# Patient Record
Sex: Female | Born: 1970
Health system: Southern US, Community
[De-identification: ages and names within clinical notes are randomized; demographics above are authoritative.]

## PROBLEM LIST (undated history)

## (undated) DIAGNOSIS — N289 Disorder of kidney and ureter, unspecified: Secondary | ICD-10-CM

## (undated) HISTORY — PX: FRACTURE SURGERY: SHX138

## (undated) HISTORY — PX: TUBAL LIGATION: SHX77

## (undated) HISTORY — PX: UTERINE STENT PLACEMENT: SHX6655

---

## 2004-06-01 ENCOUNTER — Emergency Department: Payer: Self-pay | Admitting: Emergency Medicine

## 2005-01-02 ENCOUNTER — Ambulatory Visit: Payer: Self-pay | Admitting: Unknown Physician Specialty

## 2005-05-19 ENCOUNTER — Emergency Department: Payer: Self-pay | Admitting: Emergency Medicine

## 2006-09-03 ENCOUNTER — Ambulatory Visit: Payer: Self-pay | Admitting: Unknown Physician Specialty

## 2007-03-25 ENCOUNTER — Ambulatory Visit: Payer: Self-pay | Admitting: Internal Medicine

## 2009-03-20 ENCOUNTER — Emergency Department: Payer: Self-pay | Admitting: Emergency Medicine

## 2011-11-16 ENCOUNTER — Emergency Department: Payer: Self-pay | Admitting: Emergency Medicine

## 2011-11-19 ENCOUNTER — Emergency Department: Payer: Self-pay | Admitting: Emergency Medicine

## 2012-11-26 ENCOUNTER — Emergency Department: Payer: Self-pay | Admitting: Emergency Medicine

## 2012-11-26 LAB — URINALYSIS, COMPLETE
Bilirubin,UR: NEGATIVE
Glucose,UR: NEGATIVE mg/dL (ref 0–75)
Hyaline Cast: 2
Nitrite: NEGATIVE
Ph: 6 (ref 4.5–8.0)
Protein: 30
RBC,UR: 8 /HPF (ref 0–5)
Specific Gravity: 1.018 (ref 1.003–1.030)
Squamous Epithelial: 10
WBC UR: 14 /HPF (ref 0–5)

## 2012-11-26 LAB — CBC
HCT: 42 % (ref 35.0–47.0)
HGB: 14.1 g/dL (ref 12.0–16.0)
MCH: 31.1 pg (ref 26.0–34.0)
MCHC: 33.5 g/dL (ref 32.0–36.0)
MCV: 93 fL (ref 80–100)
Platelet: 274 10*3/uL (ref 150–440)
RBC: 4.53 10*6/uL (ref 3.80–5.20)
RDW: 13.3 % (ref 11.5–14.5)
WBC: 8 10*3/uL (ref 3.6–11.0)

## 2012-11-26 LAB — BASIC METABOLIC PANEL
Anion Gap: 7 (ref 7–16)
BUN: 11 mg/dL (ref 7–18)
Calcium, Total: 8.7 mg/dL (ref 8.5–10.1)
Chloride: 106 mmol/L (ref 98–107)
Co2: 24 mmol/L (ref 21–32)
Creatinine: 0.91 mg/dL (ref 0.60–1.30)
EGFR (African American): >60
EGFR (Non-African Amer.): >60
Glucose: 96 mg/dL (ref 65–99)
Osmolality: 273 (ref 275–301)
Potassium: 3.8 mmol/L (ref 3.5–5.1)
Sodium: 137 mmol/L (ref 136–145)

## 2013-01-02 ENCOUNTER — Emergency Department (HOSPITAL_COMMUNITY)
Admission: EM | Admit: 2013-01-02 | Discharge: 2013-01-02 | Disposition: A | Discharge status: Home / Self Care | Payer: Medicaid Other | Attending: Emergency Medicine | Admitting: Emergency Medicine

## 2013-01-02 ENCOUNTER — Encounter (HOSPITAL_COMMUNITY): Payer: Self-pay | Admitting: Emergency Medicine

## 2013-01-02 DIAGNOSIS — O0091 Unspecified ectopic pregnancy with intrauterine pregnancy: Secondary | ICD-10-CM | POA: Insufficient documentation

## 2013-01-02 DIAGNOSIS — O200 Threatened abortion: Secondary | ICD-10-CM

## 2013-01-02 DIAGNOSIS — Z87442 Personal history of urinary calculi: Secondary | ICD-10-CM | POA: Insufficient documentation

## 2013-01-02 DIAGNOSIS — Z3490 Encounter for supervision of normal pregnancy, unspecified, unspecified trimester: Secondary | ICD-10-CM

## 2013-01-02 HISTORY — DX: Disorder of kidney and ureter, unspecified: N28.9

## 2013-01-02 LAB — HCG, QUANTITATIVE, PREGNANCY: hCG, Beta Chain, Quant, S: 106819 m[IU]/mL — ABNORMAL HIGH (ref <5)

## 2013-01-02 LAB — CBC
HCT: 39 % (ref 36.0–46.0)
Hemoglobin: 13.4 g/dL (ref 12.0–15.0)
MCH: 31 pg (ref 26.0–34.0)
MCHC: 34.4 g/dL (ref 30.0–36.0)
MCV: 90.3 fL (ref 78.0–100.0)
Platelets: 280 10*3/uL (ref 150–400)
RBC: 4.32 MIL/uL (ref 3.87–5.11)
RDW: 12.6 % (ref 11.5–15.5)
WBC: 9.1 10*3/uL (ref 4.0–10.5)

## 2013-01-02 LAB — ABO/RH: ABO/RH(D): O POS

## 2013-01-02 MED ORDER — ONDANSETRON 4 MG PO TBDP: 4.0000 mg | ORAL_TABLET | Freq: Three times a day (TID) | ORAL | Status: DC | PRN

## 2013-01-02 NOTE — ED Provider Notes (Signed)
CSN: [id number]     Arrival date & time 01/02/13  1802 History   First MD Initiated Contact with Patient 01/02/13 2117     Chief Complaint  Patient presents with  . 12w pregnant/ spotting     HPI  Patient is G4 P3 currently approximately [redacted] weeks gestation by dates. For 3 pregnancies complicated only with hyperemesis. Has had minimal nausea and vomiting during this pregnancy. Has not seen OB yet. Has had no cramping or discomfort however she had a little blood when she went to the restroom earlier. Was clear that this was vaginal blood not hematuria. Has no urinary symptoms. Does not know her blood type.  Past Medical History  Diagnosis Date  . Renal disorder     kidney stones   Past Surgical History  Procedure Laterality Date  . Cesarean section     No family history on file. History  Substance Use Topics  . Smoking status: Never Smoker   . Smokeless tobacco: Not on file  . Alcohol Use: No   OB History   Grav Para Term Preterm Abortions TAB SAB Ect Mult Living                 Review of Systems  Constitutional: Negative for fever, chills, diaphoresis, appetite change and fatigue.  HENT: Negative for mouth sores, sore throat and trouble swallowing.   Eyes: Negative for visual disturbance.  Respiratory: Negative for cough, chest tightness, shortness of breath and wheezing.   Cardiovascular: Negative for chest pain.  Gastrointestinal: Negative for nausea, vomiting, abdominal pain, diarrhea and abdominal distention.  Endocrine: Negative for polydipsia, polyphagia and polyuria.  Genitourinary: Positive for vaginal bleeding. Negative for dysuria, frequency and hematuria.  Musculoskeletal: Negative for gait problem.  Skin: Negative for color change, pallor and rash.  Neurological: Negative for dizziness, syncope, light-headedness and headaches.  Hematological: Does not bruise/bleed easily.  Psychiatric/Behavioral: Negative for behavioral problems and confusion.    Allergies   Review of patient's allergies indicates no known allergies.  Home Medications   Current Outpatient Rx  Name  Route  Sig  Dispense  Refill  . ondansetron (ZOFRAN ODT) 4 MG disintegrating tablet   Oral   Take 1 tablet (4 mg total) by mouth every 8 (eight) hours as needed for nausea.   10 tablet   0    BP 135/81  Pulse 78  Temp(Src) 97.9 F (36.6 C) (Oral)  Resp 20  Ht 5\' 4"  (1.626 m)  Wt 140 lb 14.4 oz (63.912 kg)  BMI 24.17 kg/m2  SpO2 99%  LMP 10/18/2012 Physical Exam  Constitutional: She is oriented to person, place, and time. She appears well-developed and well-nourished. No distress.  HENT:  Head: Normocephalic.  Eyes: Conjunctivae are normal. Pupils are equal, round, and reactive to light. No scleral icterus.  Neck: Normal range of motion. Neck supple. No thyromegaly present.  Cardiovascular: Normal rate and regular rhythm.  Exam reveals no gallop and no friction rub.   No murmur heard. Pulmonary/Chest: Effort normal and breath sounds normal. No respiratory distress. She has no wheezes. She has no rales.  Abdominal: Soft. Bowel sounds are normal. She exhibits no distension. There is no tenderness. There is no rebound.  Uterus not palpable lower abdomen. Nontender in the abdomen. She's been here 3/2 hours. She's not had any additional bleeding.  Musculoskeletal: Normal range of motion.  Neurological: She is alert and oriented to person, place, and time.  Skin: Skin is warm and dry. No rash noted.  Psychiatric: She has a normal mood and affect. Her behavior is normal.    ED Course  Procedures (including critical care time) Labs Review Labs Reviewed  HCG, QUANTITATIVE, PREGNANCY - Abnormal; Notable for the following:    hCG, Beta Francene Finders [id number] (*)    All other components within normal limits  CBC  ABO/RH   Imaging Review No results found.  EKG Interpretation   None       MDM   1. Intrauterine pregnancy   2. Threatened miscarriage    Limited  bedside ultrasound shows obvious intrauterine pregnancy with fetal heart tones 146 and good fetal movements. Singleton pregnancy. Discussion I don't think a pelvic exam was necessary at this time she is asymptomatic and had trace bleeding that is now resolved.  Discharge of precautions. Recommended OB followup. Diagnosis first trimester vaginal bleeding with intrauterine pregnancy.    Roney Marion, MD 01/02/13 2136

## 2013-01-02 NOTE — ED Notes (Signed)
Pt states she is [redacted] weeks pregnant and noticed spotting this afternoon, pink in color.

## 2013-03-30 ENCOUNTER — Emergency Department: Payer: Self-pay | Admitting: Emergency Medicine

## 2013-03-30 LAB — CBC WITH DIFFERENTIAL/PLATELET
Basophil #: 0.1 10*3/uL (ref 0.0–0.1)
Basophil %: 0.6 %
Eosinophil #: 0.2 10*3/uL (ref 0.0–0.7)
Eosinophil %: 2.1 %
HCT: 34.3 % — ABNORMAL LOW (ref 35.0–47.0)
HGB: 11.3 g/dL — ABNORMAL LOW (ref 12.0–16.0)
Lymphocyte #: 1.9 10*3/uL (ref 1.0–3.6)
Lymphocyte %: 17.5 %
MCH: 29.8 pg (ref 26.0–34.0)
MCHC: 33 g/dL (ref 32.0–36.0)
MCV: 90 fL (ref 80–100)
Monocyte #: 0.7 x10 3/mm (ref 0.2–0.9)
Monocyte %: 7 %
Neutrophil #: 7.7 10*3/uL — ABNORMAL HIGH (ref 1.4–6.5)
Neutrophil %: 72.8 %
Platelet: 283 10*3/uL (ref 150–440)
RBC: 3.79 10*6/uL — ABNORMAL LOW (ref 3.80–5.20)
RDW: 13.2 % (ref 11.5–14.5)
WBC: 10.6 10*3/uL (ref 3.6–11.0)

## 2013-03-30 LAB — BASIC METABOLIC PANEL
Anion Gap: 7 (ref 7–16)
BUN: 13 mg/dL (ref 7–18)
Calcium, Total: 8.6 mg/dL (ref 8.5–10.1)
Chloride: 106 mmol/L (ref 98–107)
Co2: 23 mmol/L (ref 21–32)
Creatinine: 0.54 mg/dL — ABNORMAL LOW (ref 0.60–1.30)
EGFR (African American): >60
EGFR (Non-African Amer.): >60
Glucose: 78 mg/dL (ref 65–99)
Osmolality: 271 (ref 275–301)
Potassium: 3.7 mmol/L (ref 3.5–5.1)
Sodium: 136 mmol/L (ref 136–145)

## 2013-03-30 LAB — URINALYSIS, COMPLETE
Bilirubin,UR: NEGATIVE
Glucose,UR: NEGATIVE mg/dL (ref 0–75)
Ketone: NEGATIVE
Nitrite: NEGATIVE
Ph: 6 (ref 4.5–8.0)
Protein: NEGATIVE
RBC,UR: 1 /HPF (ref 0–5)
Specific Gravity: 1.018 (ref 1.003–1.030)
Squamous Epithelial: 9
WBC UR: 7 /HPF (ref 0–5)

## 2013-03-30 LAB — HCG, QUANTITATIVE, PREGNANCY: Beta Hcg, Quant.: 16594 m[IU]/mL — ABNORMAL HIGH

## 2013-03-30 LAB — TROPONIN I: Troponin-I: <0.02 ng/mL

## 2013-06-20 ENCOUNTER — Observation Stay: Payer: Self-pay | Admitting: Obstetrics and Gynecology

## 2013-06-20 LAB — URINALYSIS, COMPLETE
Bacteria: NONE SEEN
Bilirubin,UR: NEGATIVE
Blood: NEGATIVE
Glucose,UR: NEGATIVE mg/dL (ref 0–75)
Ketone: NEGATIVE
Leukocyte Esterase: NEGATIVE
Nitrite: NEGATIVE
Ph: 6 (ref 4.5–8.0)
Protein: NEGATIVE
RBC,UR: NONE SEEN /HPF (ref 0–5)
Specific Gravity: 1.019 (ref 1.003–1.030)
Squamous Epithelial: 3
WBC UR: NONE SEEN /HPF (ref 0–5)

## 2013-06-29 ENCOUNTER — Observation Stay: Payer: Self-pay | Admitting: Obstetrics and Gynecology

## 2013-06-30 LAB — PIH PROFILE
Anion Gap: 8 (ref 7–16)
BUN: 8 mg/dL (ref 7–18)
Calcium, Total: 8.9 mg/dL (ref 8.5–10.1)
Chloride: 106 mmol/L (ref 98–107)
Co2: 23 mmol/L (ref 21–32)
Creatinine: 0.51 mg/dL — ABNORMAL LOW (ref 0.60–1.30)
EGFR (African American): >60
EGFR (Non-African Amer.): >60
Glucose: 85 mg/dL (ref 65–99)
HCT: 38.1 % (ref 35.0–47.0)
HGB: 12.7 g/dL (ref 12.0–16.0)
MCH: 31.2 pg (ref 26.0–34.0)
MCHC: 33.4 g/dL (ref 32.0–36.0)
MCV: 93 fL (ref 80–100)
Osmolality: 271 (ref 275–301)
Platelet: 203 10*3/uL (ref 150–440)
Potassium: 3.7 mmol/L (ref 3.5–5.1)
RBC: 4.08 10*6/uL (ref 3.80–5.20)
RDW: 18.5 % — ABNORMAL HIGH (ref 11.5–14.5)
SGOT(AST): 21 U/L (ref 15–37)
Sodium: 137 mmol/L (ref 136–145)
Uric Acid: 3.9 mg/dL (ref 2.6–6.0)
WBC: 12.3 10*3/uL — ABNORMAL HIGH (ref 3.6–11.0)

## 2013-06-30 LAB — PROTEIN / CREATININE RATIO, URINE
Creatinine, Urine: 148.4 mg/dL — ABNORMAL HIGH (ref 30.0–125.0)
Protein, Random Urine: 9 mg/dL (ref 0–12)
Protein/Creat. Ratio: 61 mg/gCREAT (ref 0–200)

## 2013-07-10 ENCOUNTER — Observation Stay: Payer: Self-pay

## 2013-07-10 LAB — URINALYSIS, COMPLETE
Bacteria: NONE SEEN
Bilirubin,UR: NEGATIVE
Blood: NEGATIVE
Glucose,UR: NEGATIVE mg/dL (ref 0–75)
Hyaline Cast: 3
Leukocyte Esterase: NEGATIVE
Nitrite: NEGATIVE
Ph: 5 (ref 4.5–8.0)
Protein: NEGATIVE
RBC,UR: 1 /HPF (ref 0–5)
Specific Gravity: 1.021 (ref 1.003–1.030)
Squamous Epithelial: NONE SEEN
WBC UR: 1 /HPF (ref 0–5)

## 2013-07-17 ENCOUNTER — Inpatient Hospital Stay: Payer: Self-pay | Admitting: Obstetrics & Gynecology

## 2013-07-17 LAB — PIH PROFILE
Anion Gap: 10 (ref 7–16)
BUN: 11 mg/dL (ref 7–18)
Calcium, Total: 8.8 mg/dL (ref 8.5–10.1)
Chloride: 106 mmol/L (ref 98–107)
Co2: 21 mmol/L (ref 21–32)
Creatinine: 0.62 mg/dL (ref 0.60–1.30)
EGFR (African American): >60
EGFR (Non-African Amer.): >60
Glucose: 65 mg/dL (ref 65–99)
HCT: 39.6 % (ref 35.0–47.0)
HGB: 13.3 g/dL (ref 12.0–16.0)
MCH: 31.3 pg (ref 26.0–34.0)
MCHC: 33.5 g/dL (ref 32.0–36.0)
MCV: 93 fL (ref 80–100)
Osmolality: 271 (ref 275–301)
Platelet: 190 10*3/uL (ref 150–440)
Potassium: 3.6 mmol/L (ref 3.5–5.1)
RBC: 4.24 10*6/uL (ref 3.80–5.20)
RDW: 17.5 % — ABNORMAL HIGH (ref 11.5–14.5)
SGOT(AST): 19 U/L (ref 15–37)
Sodium: 137 mmol/L (ref 136–145)
Uric Acid: 4.5 mg/dL (ref 2.6–6.0)
WBC: 10.3 10*3/uL (ref 3.6–11.0)

## 2013-07-17 LAB — PROTEIN / CREATININE RATIO, URINE
Creatinine, Urine: 74.2 mg/dL (ref 30.0–125.0)
Protein, Random Urine: 10 mg/dL (ref 0–12)
Protein/Creat. Ratio: 135 mg/gCREAT (ref 0–200)

## 2013-07-18 LAB — HEMATOCRIT: HCT: 33.5 % — ABNORMAL LOW (ref 35.0–47.0)

## 2013-07-20 LAB — PATHOLOGY REPORT

## 2013-10-12 ENCOUNTER — Ambulatory Visit: Payer: Self-pay | Admitting: Obstetrics & Gynecology

## 2013-10-12 LAB — CBC
HCT: 42.8 % (ref 35.0–47.0)
HGB: 13.6 g/dL (ref 12.0–16.0)
MCH: 30.3 pg (ref 26.0–34.0)
MCHC: 31.7 g/dL — ABNORMAL LOW (ref 32.0–36.0)
MCV: 96 fL (ref 80–100)
Platelet: 244 10*3/uL (ref 150–440)
RBC: 4.47 10*6/uL (ref 3.80–5.20)
RDW: 13.5 % (ref 11.5–14.5)
WBC: 5.9 10*3/uL (ref 3.6–11.0)

## 2013-10-17 ENCOUNTER — Ambulatory Visit: Payer: Self-pay | Admitting: Obstetrics & Gynecology

## 2013-10-24 LAB — PATHOLOGY REPORT

## 2014-06-09 NOTE — Op Note (Signed)
PATIENT NAME:  [name] Fernandez, [name] Fernandez MR#:  [id number] DATE OF BIRTH:  [birth year]  DATE OF PROCEDURE:  10/17/2013  PREOPERATIVE DIAGNOSIS: Cervical intraepithelial neoplasia type 2 and 3.  POSTOPERATIVE DIAGNOSIS: Cervical intraepithelial neoplasia type 2 and 3.  PROCEDURE: Loop electrosurgical excisional procedure.   SURGEON: Glean Salen, MD  ANESTHESIA: General.   ESTIMATED BLOOD LOSS: Minimal.   COMPLICATIONS: None.   SPECIMEN: Ectocervical specimen an endocervical specimen.   DISPOSITION: To recovery room stable.   TECHNIQUE: The patient is prepped and draped in the usual sterile fashion after adequate anesthesia is obtained in the dorsal lithotomy position. The cervix is identified after speculum was placed. Using a wide loop electrode, an ectocervical specimen is obtained with careful attention to the 12 o'clock position as this was where a cervical biopsy had been positive for CIN 2-3. A second endocervical specimen is obtained also with the wide loop electrode due to the anatomy of her cervix, with careful deep penetration for a good endocervical component. The ectocervical specimen is labeled at 12 o'clock position with a suture. The endocervical was not labeled. Both were sent to pathology for further review. Hemostasis assured at the LEEP site with electrocautery. Excellent hemostasis is noted. Speculum is removed, and the patient goes to recovery room in stable condition. All sponge, instrument and needle counts are correct.   ____________________________ R. Barnett Applebaum, MD rph:lb D: 10/17/2013 13:06:04 ET T: 10/17/2013 14:06:47 ET JOB#: [id number]  cc: Glean Salen, MD, <Dictator> Gae Dry MD ELECTRONICALLY SIGNED 10/18/2013 5:28

## 2014-06-09 NOTE — Op Note (Signed)
PATIENT NAME:  [name], Fernandez MR#:  [id number] DATE OF BIRTH:  [birth year]  DATE OF PROCEDURE:  07/17/2013  PREOPERATIVE DIAGNOSES: Term intrauterine pregnancy, prior history cesarean section, desire for permanent sterility.  POSTOPERATIVE DIAGNOSES: Term intrauterine pregnancy, prior history cesarean section, desire for permanent sterility.  PROCEDURES: Low transverse cesarean section, bilateral tubal ligation, placement of On-Q pain pump.   SURGEON: Dr. Kenton Kingfisher.   ASSISTANT: Midwife Faison.   ANESTHESIA: Spinal.   ESTIMATED BLOOD LOSS: 500 mL.   COMPLICATIONS: None.   FINDINGS: Normal tubes, ovaries, uterus. Viable female weighing 8 pounds, 7 ounces with Apgar scores of 8 and 9 at one and five minutes, respectively.   DISPOSITION: To the recovery room in stable condition.   TECHNIQUE: The patient is prepped and draped in the usual sterile fashion after adequate anesthesia is obtained in the supine position on the operating room table. Scalpel was used to create a low transverse skin incision down to the level of the rectus fascia whic is then dissected bilaterally using Mayo scissors. Rectus muscles were separated from the fascia and separated in the midline. The peritoneum was penetrated and the bladder is inferiorly dissected and retracted. A hysterotomy incision is created with a scalpel and extended by blunt dissection after the amniotomy reveals clear fluid. The infant's head is grasped and delivered and suctioning of the oropharynx is performed with no nuchal cord noted. Infant is handed to the pediatric team.   Cord blood is obtained and the placenta is manually extracted. The uterus is externalized and cleansed of all membranes and debris using a moist sponge. The hysterotomy incision is closed with a running #1 Vicryl suture in a locking fashion followed by a second layer to imbricate the first layer with excellent hemostasis noted.   The right and left fallopian tubes are  grasped in the midportion with Babcock clamp and a loop was tied with 2 Vicryl sutures, excised, and cauterized. There was excellent hemostasis noted at the tubal sites. Tubal segments were sent to pathology for further review.   The uterus was then placed back in the intra-abdominal cavity and the paracolic gutters are irrigated with warm saline. Re-examination of the incision reveals excellent hemostasis. The peritoneum is closed and the On-Q pain pump catheters are placed through trocars that are placed through the abdomen into the subfascial space. The catheters are slid into place and the fascia is closed with Maxon suture with careful placement not to incorporate these catheters. The catheters are flushed with 5 mL each of bupivacaine and stabilized in place with Dermabond and a bandage.   Subcutaneous tissues are irrigated and hemostasis is assured using electrocautery. Skin is closed with a 4-0 Vicryl suture in a subcuticular fashion followed by placement of Dermabond. The patient goes to the recovery room in stable condition. All sponge, instrument, and needle counts are correct.   ____________________________ R. Barnett Applebaum, MD rph:lt D: 07/17/2013 22:20:06 ET T: 07/18/2013 03:58:49 ET JOB#: [id number]  cc: Glean Salen, MD, <Dictator> Gae Dry MD ELECTRONICALLY SIGNED 07/18/2013 15:23

## 2014-06-26 NOTE — H&P (Signed)
L&D Evaluation:  History:  HPI 44 yo G4P3003 at [redacted]w[redacted]d by [place] of 07/24/13 presenting with abdominal pain, decreased fetal movement.  No LOF, no VB, no ctx.  Pain bilateral groin, positional, wrose with standing   Presents with abdominal pain, decreased fetal movement   Patient's Medical History depression, neprholithiasis    Patient's Surgical History C-section    Medications Pre Natal Vitamins    Allergies NKDA   Social History none    Family History Non-Contributory    ROS:  ROS All systems were reviewed.  HEENT, CNS, GI, GU, Respiratory, CV, Renal and Musculoskeletal systems were found to be normal.   Exam:  Vital Signs stable    Urine Protein not completed   General no apparent distress   Mental Status clear    Chest no increased work of breathing    Abdomen gravid, non-tender   Estimated Fetal Weight Average for gestational age   Back no CVAT   Edema no edema    Pelvic no external lesions, 1/50/-3 unchanged over 2-hrs   FHT Description 140, moderate, positive accels, no decels categoyr I tracing reactive NST   Ucx irregular   Impression:  Impression reactive NST, decreased fetal movment, round ligament pain   Plan:  Comments 1) Decreased Fetal Movement - reactive NST  2) Fetus - category I tracing  3) History of prior C-section - VBAC risk calculator chance of successfull VBAC 74.4%  4) Round ligament pain - supportive measures  5) Disposition discharge home follow up in plcae 5/8   Electronic Signatures: Dorthula Nettles (MD)  (Signed 09-May-15 19:43)  Authored: L&D Evaluation   Last Updated: 09-May-15 19:43 by Dorthula Nettles (MD)

## 2014-06-26 NOTE — H&P (Signed)
L&D Evaluation:  History:  HPI Pt is a 44 yo G4P3003 at [redacted] weeks GA with an EDC of 07/24/13 who presents to L&D with reports of contractions that started earlier today. She reports that they have worsened since she came to the hospital. She also reports feeling a gush earlier in the day that made her underwear wet. Her medical hsitory is significant for prior svd but a c/s with her last delivery for failure to progress and fetal intolerance of labor. Her prenatal labs are O+, RI, VI, and GBS negative. She recieved her TDaP this pregnancy. She also desires a PP BTL.   Presents with contractions, leaking fluid   Patient's Medical History Abnormal pap- needs repeat PP, major depression, anxiety, kidney stones, HPV positive.   Patient's Surgical History Previous C-Section   Medications Pre Natal Vitamins   Allergies NKDA   Social History none   Family History Non-Contributory   ROS:  ROS All systems were reviewed.  HEENT, CNS, GI, GU, Respiratory, CV, Renal and Musculoskeletal systems were found to be normal.   Exam:  Vital Signs stable   General no apparent distress   Mental Status clear   Chest clear   Heart normal sinus rhythm   Abdomen gravid, tender with contractions   Pelvic cervix closed and thick, FT/thick/-3   Mebranes Intact   FHT normal rate with no decels, 130's   Ucx regular, Q2-3   Skin dry, no lesions, no rashes   Lymph no lymphadenopathy   Impression:  Impression dehydration, reactive NST, IUP at 38 weeks, r/o labor   Plan:  Plan EFM/NST, monitor contractions and for cervical change, fluids   Comments Patient desires to speak with Dr. Kenton Kingfisher- Dr. Kenton Kingfisher in to see patient. Per Dr. Kenton Kingfisher- Will keep overnight due to prior precipitous delivery and patients anxiety level as well as patient living 30 minutes from the hospital. Will start fluids for 2+ ketones in her urine. Will reassess cervix and make dispo decision in the morning.   Follow Up  Appointment need to schedule   Electronic Signatures: Louisa Second (CNM)  (Signed 25-May-15 19:32)  Authored: L&D Evaluation   Last Updated: 25-May-15 19:32 by Louisa Second (CNM)

## 2014-06-26 NOTE — H&P (Signed)
L&D Evaluation:  History:  HPI 44 yo G4P3003 at [redacted]w[redacted]d by [facility] of 07/24/13 presenting with contractions.  No LOF, no VB, +FM.  Has been contracting since this afternoon, irregular   Presents with abdominal pain, decreased fetal movement   Patient's Medical History depression, neprholithiasis   Patient's Surgical History C-section   Medications Pre Natal Vitamins   Allergies NKDA   Social History none   Family History Non-Contributory   ROS:  ROS All systems were reviewed.  HEENT, CNS, GI, GU, Respiratory, CV, Renal and Musculoskeletal systems were found to be normal.   Exam:  Vital Signs BP >140/90  135-158/67-78   Urine Protein not completed   General no apparent distress   Mental Status clear   Chest no increased work of breathing   Abdomen gravid, non-tender   Estimated Fetal Weight Average for gestational age   Back no CVAT   Edema no edema   Pelvic no external lesions, 1/50/-3 unchanged over 1-hr and since last check 06/20/13   FHT Description 140, moderate, positive accels, no decels categoyr I tracing reactive NST   Ucx irregular   Impression:  Impression reactive NST, Elevated BP and braxton hicks contractions   Plan:  Comments 1) R/O PTL - no cervical change irregular contractions  2) Fetus - category I tracing  3) History of prior C-section - VBAC risk calculator chance of successfull VBAC 74.4%  4) Elevated BP - asymptomatic, no history of CHTN or prior elevated BP's this pregnancy.  Will send Wallace panel and protein/Cr ratio     - arrange for growth ultrasound/AFI and NST with next visit 07/03/13  5) Disposition discharge home follow up in place 5/18  Addendum PIH panel and Pr/Cr ratio normal   Electronic Signatures: Dorthula Nettles (MD)  (Signed 15-May-15 01:39)  Authored: L&D Evaluation   Last Updated: 15-May-15 01:39 by Dorthula Nettles (MD)

## 2014-06-26 NOTE — H&P (Signed)
L&D Evaluation:  History:  HPI Pt is a 44 yo G4P3003 at [redacted] weeks GA with an EDC of 07/24/13 who presents to L&D after being sent over from the office with elevated BP in 140's/90's. She reports that she has been having some blurred vision that has been going on for several days and a headache that started today that has not gotten any better. She denies RUQ pain. She was seen on L&D 1 week ago for contractions and was sent home after not making any changes.hospital. Her medical history is significant for prior svd but a c/s with her last delivery for failure to progress and fetal intolerance of labor. Her prenatal labs are O+, RI, VI, and GBS negative. She recieved her TDaP this pregnancy. She also desires a PP BTL.   Presents with other, high blood pressure, blurred vision and headache   Patient's Medical History Abnormal pap- needs repeat PP, major depression, anxiety, kidney stones, HPV positive.   Patient's Surgical History Previous C-Section   Medications Pre [place] Vitamins   Allergies NKDA   Social History none   Family History Non-Contributory   ROS:  General + HA and + blurred vision   Exam:  Vital Signs BP >140/90  129-156/70-90   General no apparent distress   Mental Status clear   Chest clear   Heart normal sinus rhythm   Abdomen gravid, non-tender   Pelvic cervix closed and thick, 1/long/-1   Mebranes Intact   FHT normal rate with no decels, 130's   Ucx regular, Q2-3 mild   Skin dry, no lesions, no rashes   Lymph no lymphadenopathy   Other normal PIH labs PC ratio 135   Impression:  Impression dehydration, reactive NST, IUP at 39 weeks preeclampsia without severe features- based on BP and visual disturbances   Plan:  Plan EFM/NST, other, repeat c/s now due to continuance of elevated BP and visual disturbances   Comments Plan of care discussed with patient and Dr. Kenton Kingfisher- agreed on c/s for delivery and to monitor BP and s/sx of eclampsia postpartum    Follow Up Appointment need to schedule   Electronic Signatures: [name] Fernandez (CNM)  (Signed 01-Jun-15 21:22)  Authored: L&D Evaluation   Last Updated: 01-Jun-15 21:22 by [name] Fernandez (CNM)

## 2015-01-31 ENCOUNTER — Emergency Department (HOSPITAL_COMMUNITY): Payer: BLUE CROSS/BLUE SHIELD

## 2015-01-31 ENCOUNTER — Encounter (HOSPITAL_COMMUNITY): Payer: Self-pay | Admitting: Emergency Medicine

## 2015-01-31 ENCOUNTER — Emergency Department (HOSPITAL_COMMUNITY)
Admission: EM | Admit: 2015-01-31 | Discharge: 2015-02-01 | Disposition: A | Discharge status: Home / Self Care | Payer: BLUE CROSS/BLUE SHIELD | Attending: Emergency Medicine | Admitting: Emergency Medicine

## 2015-01-31 DIAGNOSIS — Z87448 Personal history of other diseases of urinary system: Secondary | ICD-10-CM | POA: Insufficient documentation

## 2015-01-31 DIAGNOSIS — S3992XA Unspecified injury of lower back, initial encounter: Secondary | ICD-10-CM | POA: Diagnosis not present

## 2015-01-31 DIAGNOSIS — S6991XA Unspecified injury of right wrist, hand and finger(s), initial encounter: Secondary | ICD-10-CM | POA: Diagnosis present

## 2015-01-31 DIAGNOSIS — S4991XA Unspecified injury of right shoulder and upper arm, initial encounter: Secondary | ICD-10-CM | POA: Insufficient documentation

## 2015-01-31 DIAGNOSIS — Z87442 Personal history of urinary calculi: Secondary | ICD-10-CM | POA: Insufficient documentation

## 2015-01-31 DIAGNOSIS — S62300A Unspecified fracture of second metacarpal bone, right hand, initial encounter for closed fracture: Secondary | ICD-10-CM | POA: Insufficient documentation

## 2015-01-31 DIAGNOSIS — Y92410 Unspecified street and highway as the place of occurrence of the external cause: Secondary | ICD-10-CM | POA: Insufficient documentation

## 2015-01-31 DIAGNOSIS — S0083XA Contusion of other part of head, initial encounter: Secondary | ICD-10-CM | POA: Insufficient documentation

## 2015-01-31 DIAGNOSIS — S0285XA Fracture of orbit, unspecified, initial encounter for closed fracture: Secondary | ICD-10-CM

## 2015-01-31 DIAGNOSIS — S6291XA Unspecified fracture of right wrist and hand, initial encounter for closed fracture: Secondary | ICD-10-CM

## 2015-01-31 DIAGNOSIS — Y9389 Activity, other specified: Secondary | ICD-10-CM | POA: Insufficient documentation

## 2015-01-31 DIAGNOSIS — S299XXA Unspecified injury of thorax, initial encounter: Secondary | ICD-10-CM | POA: Diagnosis not present

## 2015-01-31 DIAGNOSIS — S8991XA Unspecified injury of right lower leg, initial encounter: Secondary | ICD-10-CM | POA: Diagnosis not present

## 2015-01-31 DIAGNOSIS — S0232XA Fracture of orbital floor, left side, initial encounter for closed fracture: Secondary | ICD-10-CM | POA: Diagnosis not present

## 2015-01-31 DIAGNOSIS — S199XXA Unspecified injury of neck, initial encounter: Secondary | ICD-10-CM | POA: Insufficient documentation

## 2015-01-31 DIAGNOSIS — Y998 Other external cause status: Secondary | ICD-10-CM | POA: Diagnosis not present

## 2015-01-31 DIAGNOSIS — F17200 Nicotine dependence, unspecified, uncomplicated: Secondary | ICD-10-CM | POA: Diagnosis not present

## 2015-01-31 MED ORDER — OXYCODONE-ACETAMINOPHEN 5-325 MG PO TABS: 1.0000 | ORAL_TABLET | Freq: Four times a day (QID) | ORAL | Status: AC | PRN

## 2015-01-31 MED ORDER — MORPHINE SULFATE (PF) 4 MG/ML IV SOLN
4.0000 mg | Freq: Once | INTRAVENOUS | Status: AC
  Administered 2015-01-31: 4 mg via INTRAVENOUS
  Filled 2015-01-31: qty 1

## 2015-01-31 MED ORDER — MORPHINE SULFATE (PF) 2 MG/ML IV SOLN
INTRAVENOUS | Status: AC
  Administered 2015-01-31: 4 mg via INTRAMUSCULAR
  Filled 2015-01-31: qty 2

## 2015-01-31 MED ORDER — ONDANSETRON HCL 4 MG/2ML IJ SOLN
INTRAMUSCULAR | Status: AC
  Administered 2015-01-31: 4 mg
  Filled 2015-01-31: qty 2

## 2015-01-31 MED ORDER — ACETAMINOPHEN 325 MG PO TABS
650.0000 mg | ORAL_TABLET | Freq: Once | ORAL | Status: AC
  Administered 2015-01-31: 650 mg via ORAL
  Filled 2015-01-31: qty 2
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
## 2015-01-31 NOTE — ED Notes (Signed)
Pt transported to radiology.
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
## 2015-01-31 NOTE — Progress Notes (Signed)
Orthopedic Tech Progress Note Patient Details:  [name] Fernandez November 28, 1970 [id number]  Ortho Devices Type of Ortho Device: Ace wrap, Rad Gutter splint Ortho Device/Splint Location: RUE Ortho Device/Splint Interventions: Ordered, Application   Braulio Bosch 01/31/2015, 9:59 PM

## 2015-01-31 NOTE — Discharge Instructions (Signed)
°Cast or Splint Care  ° ° °Casts and splints support injured limbs and keep bones from moving while they heal. It is important to care for your cast or splint at home.  °HOME CARE INSTRUCTIONS  °Keep the cast or splint uncovered during the drying period. It can take 24 to 48 hours to dry if it is made of plaster. A fiberglass cast will dry in less than 1 hour.  °Do not rest the cast on anything harder than a pillow for the first 24 hours.  °Do not put weight on your injured limb or apply pressure to the cast until your health care provider gives you permission.  °Keep the cast or splint dry. Wet casts or splints can lose their shape and may not support the limb as well. A wet cast that has lost its shape can also create harmful pressure on your skin when it dries. Also, wet skin can become infected.  °Cover the cast or splint with a plastic bag when bathing or when out in the rain or snow. If the cast is on the trunk of the body, take sponge baths until the cast is removed.  °If your cast does become wet, dry it with a towel or a blow dryer on the cool setting only. °Keep your cast or splint clean. Soiled casts may be wiped with a moistened cloth.  °Do not place any hard or soft foreign objects under your cast or splint, such as cotton, toilet paper, lotion, or powder.  °Do not try to scratch the skin under the cast with any object. The object could get stuck inside the cast. Also, scratching could lead to an infection. If itching is a problem, use a blow dryer on a cool setting to relieve discomfort.  °Do not trim or cut your cast or remove padding from inside of it.  °Exercise all joints next to the injury that are not immobilized by the cast or splint. For example, if you have a long leg cast, exercise the hip joint and toes. If you have an arm cast or splint, exercise the shoulder, elbow, thumb, and fingers.  °Elevate your injured arm or leg on 1 or 2 pillows for the first 1 to 3 days to decrease swelling and  pain. It is best if you can comfortably elevate your cast so it is higher than your heart. °SEEK MEDICAL CARE IF:  °Your cast or splint cracks.  °Your cast or splint is too tight or too loose.  °You have unbearable itching inside the cast.  °Your cast becomes wet or develops a soft spot or area.  °You have a bad smell coming from inside your cast.  °You get an object stuck under your cast.  °Your skin around the cast becomes red or raw.  °You have new pain or worsening pain after the cast has been applied. °SEEK IMMEDIATE MEDICAL CARE IF:  °You have fluid leaking through the cast.  °You are unable to move your fingers or toes.  °You have discolored (blue or white), cool, painful, or very swollen fingers or toes beyond the cast.  °You have tingling or numbness around the injured area.  °You have severe pain or pressure under the cast.  °You have any difficulty with your breathing or have shortness of breath.  °You have chest pain. °This information is not intended to replace advice given to you by your health care provider. Make sure you discuss any questions you have with your health care provider.  °  Document Released: 01/31/2000 Document Revised: 11/23/2012 Document Reviewed: 08/11/2012  °Elsevier Interactive Patient Education ©2016 [employer]  ° °

## 2015-02-05 ENCOUNTER — Encounter (HOSPITAL_BASED_OUTPATIENT_CLINIC_OR_DEPARTMENT_OTHER): Payer: Self-pay | Admitting: *Deleted

## 2015-02-05 ENCOUNTER — Other Ambulatory Visit: Payer: Self-pay | Admitting: Orthopedic Surgery

## 2015-02-07 ENCOUNTER — Ambulatory Visit (HOSPITAL_BASED_OUTPATIENT_CLINIC_OR_DEPARTMENT_OTHER)
Admission: RE | Admit: 2015-02-07 | Discharge: 2015-02-07 | Disposition: A | Discharge status: Home / Self Care | Payer: BLUE CROSS/BLUE SHIELD | Source: Ambulatory Visit | Attending: Orthopedic Surgery | Admitting: Orthopedic Surgery

## 2015-02-07 ENCOUNTER — Encounter (HOSPITAL_BASED_OUTPATIENT_CLINIC_OR_DEPARTMENT_OTHER)
Admission: RE | Disposition: A | Discharge status: Home / Self Care | Payer: Self-pay | Source: Ambulatory Visit | Attending: Orthopedic Surgery

## 2015-02-07 ENCOUNTER — Ambulatory Visit (HOSPITAL_BASED_OUTPATIENT_CLINIC_OR_DEPARTMENT_OTHER): Payer: BLUE CROSS/BLUE SHIELD | Admitting: Anesthesiology

## 2015-02-07 ENCOUNTER — Encounter (HOSPITAL_BASED_OUTPATIENT_CLINIC_OR_DEPARTMENT_OTHER): Payer: Self-pay | Admitting: *Deleted

## 2015-02-07 DIAGNOSIS — F17210 Nicotine dependence, cigarettes, uncomplicated: Secondary | ICD-10-CM | POA: Diagnosis not present

## 2015-02-07 DIAGNOSIS — S62320A Displaced fracture of shaft of second metacarpal bone, right hand, initial encounter for closed fracture: Secondary | ICD-10-CM | POA: Insufficient documentation

## 2015-02-07 HISTORY — PX: OPEN REDUCTION INTERNAL FIXATION (ORIF) METACARPAL: SHX6234

## 2015-02-07 SURGERY — OPEN REDUCTION INTERNAL FIXATION (ORIF) METACARPAL
Anesthesia: General | Site: Finger | Laterality: Right

## 2015-02-07 MED ORDER — MIDAZOLAM HCL 2 MG/2ML IJ SOLN
INTRAMUSCULAR | Status: AC
  Filled 2015-02-07: qty 2

## 2015-02-07 MED ORDER — HYDROMORPHONE HCL 1 MG/ML IJ SOLN
INTRAMUSCULAR | Status: AC
  Filled 2015-02-07: qty 1

## 2015-02-07 MED ORDER — MORPHINE SULFATE 10 MG/ML IJ SOLN
INTRAMUSCULAR | Status: DC | PRN
  Administered 2015-02-07: 4 mg via INTRAVENOUS

## 2015-02-07 MED ORDER — PROPOFOL 10 MG/ML IV BOLUS
INTRAVENOUS | Status: DC | PRN
Start: 2015-02-07 — End: 2015-02-07
  Administered 2015-02-07: 150 mg via INTRAVENOUS

## 2015-02-07 MED ORDER — LIDOCAINE HCL (CARDIAC) 20 MG/ML IV SOLN
INTRAVENOUS | Status: AC
Start: 2015-02-07 — End: 2015-02-07
  Filled 2015-02-07: qty 5

## 2015-02-07 MED ORDER — SCOPOLAMINE 1 MG/3DAYS TD PT72: 1.0000 | MEDICATED_PATCH | Freq: Once | TRANSDERMAL | Status: DC | PRN

## 2015-02-07 MED ORDER — CHLORHEXIDINE GLUCONATE 4 % EX LIQD: 60.0000 mL | Freq: Once | CUTANEOUS | Status: DC

## 2015-02-07 MED ORDER — FENTANYL CITRATE (PF) 100 MCG/2ML IJ SOLN
INTRAMUSCULAR | Status: AC
  Filled 2015-02-07: qty 2

## 2015-02-07 MED ORDER — DEXAMETHASONE SODIUM PHOSPHATE 10 MG/ML IJ SOLN
INTRAMUSCULAR | Status: DC | PRN
  Administered 2015-02-07: 10 mg via INTRAVENOUS

## 2015-02-07 MED ORDER — FENTANYL CITRATE (PF) 100 MCG/2ML IJ SOLN
50.0000 ug | INTRAMUSCULAR | Status: AC | PRN
  Administered 2015-02-07 (×2): 50 ug via INTRAVENOUS
  Administered 2015-02-07: 100 ug via INTRAVENOUS
  Administered 2015-02-07 (×2): 50 ug via INTRAVENOUS

## 2015-02-07 MED ORDER — ONDANSETRON HCL 4 MG/2ML IJ SOLN: 4.0000 mg | Freq: Once | INTRAMUSCULAR | Status: DC | PRN

## 2015-02-07 MED ORDER — OXYCODONE-ACETAMINOPHEN 5-325 MG PO TABS: ORAL_TABLET | ORAL | Status: DC

## 2015-02-07 MED ORDER — CEFAZOLIN SODIUM-DEXTROSE 2-3 GM-% IV SOLR
INTRAVENOUS | Status: AC
  Filled 2015-02-07: qty 50

## 2015-02-07 MED ORDER — GLYCOPYRROLATE 0.2 MG/ML IJ SOLN: 0.2000 mg | Freq: Once | INTRAMUSCULAR | Status: DC | PRN

## 2015-02-07 MED ORDER — HYDROMORPHONE HCL 1 MG/ML IJ SOLN
0.2500 mg | INTRAMUSCULAR | Status: DC | PRN
  Administered 2015-02-07 (×4): 0.5 mg via INTRAVENOUS

## 2015-02-07 MED ORDER — LACTATED RINGERS IV SOLN
INTRAVENOUS | Status: DC
  Administered 2015-02-07 (×3): via INTRAVENOUS

## 2015-02-07 MED ORDER — CEFAZOLIN SODIUM-DEXTROSE 2-3 GM-% IV SOLR
2.0000 g | INTRAVENOUS | Status: AC
  Administered 2015-02-07: 2 g via INTRAVENOUS

## 2015-02-07 MED ORDER — MIDAZOLAM HCL 2 MG/2ML IJ SOLN
1.0000 mg | INTRAMUSCULAR | Status: DC | PRN
  Administered 2015-02-07: 2 mg via INTRAVENOUS

## 2015-02-07 MED ORDER — LIDOCAINE HCL (CARDIAC) 20 MG/ML IV SOLN
INTRAVENOUS | Status: DC | PRN
  Administered 2015-02-07: 50 mg via INTRAVENOUS

## 2015-02-07 MED ORDER — BUPIVACAINE HCL (PF) 0.25 % IJ SOLN
INTRAMUSCULAR | Status: DC | PRN
  Administered 2015-02-07: 10 mL

## 2015-02-07 MED ORDER — ONDANSETRON HCL 4 MG/2ML IJ SOLN
INTRAMUSCULAR | Status: AC
  Filled 2015-02-07: qty 2

## 2015-02-07 MED ORDER — DEXAMETHASONE SODIUM PHOSPHATE 10 MG/ML IJ SOLN
INTRAMUSCULAR | Status: AC
  Filled 2015-02-07: qty 1

## 2015-02-07 MED ORDER — 0.9 % SODIUM CHLORIDE (POUR BTL) OPTIME
TOPICAL | Status: DC | PRN
  Administered 2015-02-07: 300 mL

## 2015-02-07 MED ORDER — ONDANSETRON HCL 4 MG/2ML IJ SOLN
INTRAMUSCULAR | Status: DC | PRN
  Administered 2015-02-07: 4 mg via INTRAVENOUS

## 2015-02-07 MED ORDER — MORPHINE SULFATE (PF) 10 MG/ML IV SOLN
INTRAVENOUS | Status: AC
  Filled 2015-02-07: qty 1

## 2015-02-07 SURGICAL SUPPLY — 71 items
BANDAGE ELASTIC 3 VELCRO ST LF (GAUZE/BANDAGES/DRESSINGS) ×1 IMPLANT
BIT DRILL 1.1 (BIT) ×2
BIT DRILL 60X20X1.1XQC TMX (BIT) ×1 IMPLANT
BIT DRL 60X20X1.1XQC TMX (BIT) ×1
BLADE MINI RND TIP GREEN BEAV (BLADE) IMPLANT
BLADE SURG 15 STRL LF DISP TIS (BLADE) ×2 IMPLANT
BLADE SURG 15 STRL SS (BLADE) ×4
BNDG CMPR 9X4 STRL LF SNTH (GAUZE/BANDAGES/DRESSINGS) ×1
BNDG ESMARK 4X9 LF (GAUZE/BANDAGES/DRESSINGS) ×2 IMPLANT
BNDG GAUZE ELAST 4 BULKY (GAUZE/BANDAGES/DRESSINGS) ×2 IMPLANT
CHLORAPREP W/TINT 26ML (MISCELLANEOUS) ×2 IMPLANT
CORDS BIPOLAR (ELECTRODE) ×2 IMPLANT
COVER BACK TABLE 60X90IN (DRAPES) ×2 IMPLANT
COVER MAYO STAND STRL (DRAPES) ×2 IMPLANT
CUFF TOURNIQUET SINGLE 18IN (TOURNIQUET CUFF) ×2 IMPLANT
DRAPE EXTREMITY T 121X128X90 (DRAPE) ×2 IMPLANT
DRAPE OEC MINIVIEW 54X84 (DRAPES) ×2 IMPLANT
DRAPE SURG 17X23 STRL (DRAPES) ×2 IMPLANT
DRIVER BIT 1.5 (TRAUMA) ×2 IMPLANT
GAUZE SPONGE 4X4 12PLY STRL (GAUZE/BANDAGES/DRESSINGS) ×2 IMPLANT
GAUZE XEROFORM 1X8 LF (GAUZE/BANDAGES/DRESSINGS) ×2 IMPLANT
GLOVE BIO SURGEON STRL SZ7.5 (GLOVE) ×3 IMPLANT
GLOVE BIOGEL PI IND STRL 7.0 (GLOVE) IMPLANT
GLOVE BIOGEL PI IND STRL 8 (GLOVE) ×1 IMPLANT
GLOVE BIOGEL PI INDICATOR 7.0 (GLOVE) ×1
GLOVE BIOGEL PI INDICATOR 8 (GLOVE) ×1
GLOVE ECLIPSE 6.5 STRL STRAW (GLOVE) ×2 IMPLANT
GLOVE EXAM NITRILE MD LF STRL (GLOVE) ×1 IMPLANT
GOWN STRL REUS W/ TWL LRG LVL3 (GOWN DISPOSABLE) ×1 IMPLANT
GOWN STRL REUS W/ TWL XL LVL3 (GOWN DISPOSABLE) ×1 IMPLANT
GOWN STRL REUS W/TWL LRG LVL3 (GOWN DISPOSABLE) ×2
GOWN STRL REUS W/TWL XL LVL3 (GOWN DISPOSABLE) ×2
K-WIRE 6 .028 (WIRE) ×4
KWIRE 6 .028 (WIRE) ×2 IMPLANT
NDL HYPO 25X1 1.5 SAFETY (NEEDLE) IMPLANT
NEEDLE HYPO 22GX1.5 SAFETY (NEEDLE) IMPLANT
NEEDLE HYPO 25X1 1.5 SAFETY (NEEDLE) ×2 IMPLANT
NS IRRIG 1000ML POUR BTL (IV SOLUTION) ×2 IMPLANT
PACK BASIN DAY SURGERY FS (CUSTOM PROCEDURE TRAY) ×2 IMPLANT
PAD CAST 3X4 CTTN HI CHSV (CAST SUPPLIES) IMPLANT
PAD CAST 4YDX4 CTTN HI CHSV (CAST SUPPLIES) ×1 IMPLANT
PADDING CAST ABS 4INX4YD NS (CAST SUPPLIES) ×1
PADDING CAST ABS COTTON 4X4 ST (CAST SUPPLIES) IMPLANT
PADDING CAST COTTON 3X4 STRL (CAST SUPPLIES)
PADDING CAST COTTON 4X4 STRL (CAST SUPPLIES)
PLATE STRAIGHT LOCK 1.5 (Plate) ×1 IMPLANT
SCREW L 1.5X14 (Screw) ×1 IMPLANT
SCREW LOCKING 1.5X13MM (Screw) ×2 IMPLANT
SCREW NL 1.5X11 WRIST (Screw) ×4 IMPLANT
SCREW NL 1.5X12 (Screw) ×6 IMPLANT
SCREW NL 1.5X13 (Screw) ×2 IMPLANT
SCREW NONIOC 1.5 10M (Screw) ×1 IMPLANT
SCREW NONIOC 1.5 14M (Screw) ×1 IMPLANT
SCREW NONIOC 1.5 16M (Screw) ×2 IMPLANT
SLEEVE SCD COMPRESS KNEE MED (MISCELLANEOUS) ×1 IMPLANT
SPLINT PLASTER CAST XFAST 3X15 (CAST SUPPLIES) IMPLANT
SPLINT PLASTER CAST XFAST 4X15 (CAST SUPPLIES) IMPLANT
SPLINT PLASTER XTRA FAST SET 4 (CAST SUPPLIES)
SPLINT PLASTER XTRA FASTSET 3X (CAST SUPPLIES) ×20
STOCKINETTE 4X48 STRL (DRAPES) ×2 IMPLANT
SUT CHROMIC 4 0 PS 2 18 (SUTURE) ×1 IMPLANT
SUT ETHILON 3 0 PS 1 (SUTURE) IMPLANT
SUT ETHILON 4 0 PS 2 18 (SUTURE) ×2 IMPLANT
SUT MERSILENE 4 0 P 3 (SUTURE) IMPLANT
SUT VIC AB 3-0 PS1 18 (SUTURE)
SUT VIC AB 3-0 PS1 18XBRD (SUTURE) IMPLANT
SUT VICRYL 4-0 PS2 18IN ABS (SUTURE) ×2 IMPLANT
SYR BULB 3OZ (MISCELLANEOUS) ×2 IMPLANT
SYR CONTROL 10ML LL (SYRINGE) ×1 IMPLANT
TOWEL OR 17X24 6PK STRL BLUE (TOWEL DISPOSABLE) ×4 IMPLANT
UNDERPAD 30X30 (UNDERPADS AND DIAPERS) ×2 IMPLANT

## 2015-02-07 NOTE — Transfer of Care (Signed)
Immediate Anesthesia Transfer of Care Note  Patient: [name] Fernandez  Procedure(s) Performed: Procedure(s): OPEN REDUCTION INTERNAL FIXATION (ORIF) RIGHT INDEX METACARPAL (Right)  Patient Location: PACU  Anesthesia Type:General  Level of Consciousness: sedated  Airway & Oxygen Therapy: Patient Spontanous Breathing and Patient connected to face mask oxygen  Post-op Assessment: Report given to RN and Post -op Vital signs reviewed and stable  Post vital signs: Reviewed and stable  Last Vitals:  Filed Vitals:   02/07/15 0818  BP: 125/69  Pulse: 92  Temp: 36.8 C  Resp: 16    Complications: No apparent anesthesia complications

## 2015-02-07 NOTE — Anesthesia Postprocedure Evaluation (Signed)
Anesthesia Post Note  Patient: [name] Fernandez  Procedure(s) Performed: Procedure(s) (LRB): OPEN REDUCTION INTERNAL FIXATION (ORIF) RIGHT INDEX METACARPAL (Right)  Patient location during evaluation: PACU Anesthesia Type: General Level of consciousness: awake and alert Pain management: pain level controlled Vital Signs Assessment: post-procedure vital signs reviewed and stable Respiratory status: spontaneous breathing, nonlabored ventilation, respiratory function stable and patient connected to nasal cannula oxygen Cardiovascular status: blood pressure returned to baseline and stable Postop Assessment: no signs of nausea or vomiting Anesthetic complications: no    Last Vitals:  Filed Vitals:   02/07/15 1245 02/07/15 1330  BP: 131/81 140/82  Pulse: 77 86  Temp:  37.1 C  Resp: 13 16    Last Pain:  Filed Vitals:   02/07/15 1331  PainSc: 2                  [name] Fernandez

## 2015-02-07 NOTE — Discharge Instructions (Addendum)

## 2015-02-07 NOTE — Anesthesia Preprocedure Evaluation (Addendum)
Anesthesia Evaluation  Patient identified by MRN, date of birth, ID band Patient awake    Reviewed: Allergy & Precautions, NPO status , Patient's Chart, lab work & pertinent test results  History of Anesthesia Complications Negative for: history of anesthetic complications  Airway Mallampati: II  TM Distance: >3 FB Neck ROM: Full    Dental no notable dental hx. (+) Dental Advisory Given   Pulmonary Current Smoker,    Pulmonary exam normal breath sounds clear to auscultation       Cardiovascular negative cardio ROS Normal cardiovascular exam Rhythm:Regular Rate:Normal     Neuro/Psych negative neurological ROS  negative psych ROS   GI/Hepatic negative GI ROS, Neg liver ROS,   Endo/Other  negative endocrine ROS  Renal/GU negative Renal ROS  negative genitourinary   Musculoskeletal negative musculoskeletal ROS (+)   Abdominal   Peds negative pediatric ROS (+)  Hematology negative hematology ROS (+)   Anesthesia Other Findings   Reproductive/Obstetrics negative OB ROS                            Anesthesia Physical Anesthesia Plan  ASA: II  Anesthesia Plan: General   Post-op Pain Management:    Induction: Intravenous  Airway Management Planned: LMA  Additional Equipment:   Intra-op Plan:   Post-operative Plan: Extubation in OR  Informed Consent: I have reviewed the patients History and Physical, chart, labs and discussed the procedure including the risks, benefits and alternatives for the proposed anesthesia with the patient or authorized representative who has indicated his/her understanding and acceptance.   Dental advisory given  Plan Discussed with: CRNA  Anesthesia Plan Comments:         Anesthesia Quick Evaluation

## 2015-02-07 NOTE — Brief Op Note (Signed)
02/07/2015  11:24 AM  PATIENT:  [name] Fernandez  44 y.o. female  PRE-OPERATIVE DIAGNOSIS:  RIGHT INDEX METACARPAL FRACTURE   POST-OPERATIVE DIAGNOSIS:  RIGHT INDEX METACARPAL FRACTURE   PROCEDURE:  Procedure(s): OPEN REDUCTION INTERNAL FIXATION (ORIF) RIGHT INDEX METACARPAL (Right)  SURGEON:  Surgeon(s) and Role:    * Leanora Cover, MD - Primary  PHYSICIAN ASSISTANT:   ASSISTANTS: none   ANESTHESIA:   general  EBL:  Total I/O In: 1700 [I.V.:1700] Out: -   BLOOD ADMINISTERED:none  DRAINS: none   LOCAL MEDICATIONS USED:  MARCAINE     SPECIMEN:  No Specimen  DISPOSITION OF SPECIMEN:  N/A  COUNTS:  YES  TOURNIQUET:   Total Tourniquet Time Documented: Upper Arm (Right) - 92 minutes Total: Upper Arm (Right) - 92 minutes   DICTATION: .Other Dictation: Dictation Number [id number]  PLAN OF CARE: Discharge to home after PACU  PATIENT DISPOSITION:  PACU - hemodynamically stable.

## 2015-02-07 NOTE — Anesthesia Procedure Notes (Signed)
Procedure Name: LMA Insertion Date/Time: 02/07/2015 9:29 AM Performed by: Lieutenant Diego Pre-anesthesia Checklist: Patient identified, Emergency Drugs available, Suction available and Patient being monitored Patient Re-evaluated:Patient Re-evaluated prior to inductionOxygen Delivery Method: Circle System Utilized Preoxygenation: Pre-oxygenation with 100% oxygen Intubation Type: IV induction Ventilation: Mask ventilation without difficulty LMA: LMA inserted LMA Size: 4.0 Number of attempts: 1 Airway Equipment and Method: Bite block Placement Confirmation: positive ETCO2 and breath sounds checked- equal and bilateral Tube secured with: Tape Dental Injury: Teeth and Oropharynx as per pre-operative assessment
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
## 2015-02-07 NOTE — Op Note (Signed)
[id number] 

## 2015-02-07 NOTE — Op Note (Signed)
Intra-operative fluoroscopic images in the AP, lateral, and oblique views were taken and evaluated by myself.  Reduction and hardware placement were confirmed.  There was no intraarticular penetration of permanent hardware.  

## 2015-02-08 ENCOUNTER — Encounter (HOSPITAL_BASED_OUTPATIENT_CLINIC_OR_DEPARTMENT_OTHER): Payer: Self-pay | Admitting: Orthopedic Surgery
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
## 2015-03-27 ENCOUNTER — Ambulatory Visit (INDEPENDENT_AMBULATORY_CARE_PROVIDER_SITE_OTHER): Payer: BLUE CROSS/BLUE SHIELD | Admitting: Family Medicine

## 2015-03-27 ENCOUNTER — Encounter: Payer: Self-pay | Admitting: Family Medicine

## 2015-03-27 DIAGNOSIS — J069 Acute upper respiratory infection, unspecified: Secondary | ICD-10-CM

## 2015-03-27 DIAGNOSIS — G4700 Insomnia, unspecified: Secondary | ICD-10-CM

## 2015-03-27 DIAGNOSIS — F4321 Adjustment disorder with depressed mood: Secondary | ICD-10-CM

## 2015-03-27 DIAGNOSIS — R030 Elevated blood-pressure reading, without diagnosis of hypertension: Secondary | ICD-10-CM | POA: Diagnosis not present

## 2015-03-27 DIAGNOSIS — N289 Disorder of kidney and ureter, unspecified: Secondary | ICD-10-CM | POA: Insufficient documentation

## 2015-03-27 DIAGNOSIS — IMO0001 Reserved for inherently not codable concepts without codable children: Secondary | ICD-10-CM

## 2015-03-27 MED ORDER — TRAZODONE HCL 50 MG PO TABS
50.0000 mg | ORAL_TABLET | Freq: Every evening | ORAL | Status: DC | PRN
Start: 2015-03-27 — End: 2015-04-23

## 2015-03-27 MED ORDER — AZITHROMYCIN 250 MG PO TABS: ORAL_TABLET | ORAL | Status: DC

## 2015-03-27 MED ORDER — HYDROCODONE-ACETAMINOPHEN 7.5-325 MG PO TABS: 1.0000 | ORAL_TABLET | Freq: Four times a day (QID) | ORAL | Status: DC | PRN
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
## 2015-03-27 NOTE — Patient Instructions (Addendum)
[facility]- Release of records  [facility]  [facility]  Get cough medicine- Delsym Take antibiotics Take pain medication Trazadone  Saline rinse for nose  F/U 6 weeks for blood pressure recheck and fasting labs

## 2015-04-23 ENCOUNTER — Ambulatory Visit (INDEPENDENT_AMBULATORY_CARE_PROVIDER_SITE_OTHER): Payer: Self-pay | Admitting: Family Medicine

## 2015-04-23 ENCOUNTER — Encounter: Payer: Self-pay | Admitting: Family Medicine

## 2015-04-23 VITALS — BP 138/78 | HR 64 | Temp 98.8°F | Resp 18 | Ht 66.0 in | Wt 154.0 lb

## 2015-04-23 DIAGNOSIS — J0100 Acute maxillary sinusitis, unspecified: Secondary | ICD-10-CM

## 2015-04-23 MED ORDER — TRAZODONE HCL 50 MG PO TABS: 50.0000 mg | ORAL_TABLET | Freq: Every evening | ORAL | Status: DC | PRN

## 2015-04-23 MED ORDER — HYDROCODONE-ACETAMINOPHEN 7.5-325 MG PO TABS: 1.0000 | ORAL_TABLET | Freq: Four times a day (QID) | ORAL | Status: DC | PRN

## 2015-04-23 MED ORDER — AMOXICILLIN 500 MG PO CAPS: 500.0000 mg | ORAL_CAPSULE | Freq: Three times a day (TID) | ORAL | Status: DC

## 2015-04-23 NOTE — Patient Instructions (Signed)
F/U as needed
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
## 2015-04-26 ENCOUNTER — Encounter: Payer: Self-pay | Admitting: Family Medicine

## 2015-05-07 ENCOUNTER — Ambulatory Visit: Payer: BLUE CROSS/BLUE SHIELD | Admitting: Family Medicine

## 2015-05-14 ENCOUNTER — Encounter: Payer: Self-pay | Admitting: Family Medicine

## 2015-07-31 ENCOUNTER — Telehealth: Payer: Self-pay | Admitting: Family Medicine

## 2015-07-31 ENCOUNTER — Other Ambulatory Visit: Payer: Self-pay | Admitting: *Deleted

## 2015-07-31 MED ORDER — TRAZODONE HCL 50 MG PO TABS: 50.0000 mg | ORAL_TABLET | Freq: Every evening | ORAL | Status: DC | PRN

## 2015-07-31 NOTE — Telephone Encounter (Signed)
Tramadol has not been prescribed to patient.   Call placed to patient to inquire.   Patient requesting medication to help her rest. Advised that medication name is trazodone.   Prescription sent to pharmacy.

## 2015-07-31 NOTE — Telephone Encounter (Signed)
Patient is changing pharmacies to [facility] [address]  And needs tramadol refilled if possible  [phone]

## 2015-10-01 ENCOUNTER — Telehealth: Payer: Self-pay | Admitting: Family Medicine

## 2015-10-01 MED ORDER — TRAZODONE HCL 50 MG PO TABS: 50.0000 mg | ORAL_TABLET | Freq: Every evening | ORAL | 2 refills | Status: DC | PRN

## 2015-10-01 NOTE — Telephone Encounter (Signed)
Prescription sent to pharmacy.  Last OV was for acute sinusitis. Coded as sick visit.   Call placed to patient and patient made aware per VM.

## 2015-10-01 NOTE — Telephone Encounter (Signed)
[name] Fernandez called saying she'll be out of Trazadone in about two weeks if not sooner and needs a refill. She wasn't sure if she needed the pharmacy to call us first.   In addition, she wants to confirm that her last appointment on 05/06/2015 was coded as an office/sick visit instead of a Physical.   Please give her a call if needed.  Pt's ph# [phone] Thank you.

## 2015-11-15 ENCOUNTER — Encounter: Payer: Self-pay | Admitting: Family Medicine

## 2015-11-15 ENCOUNTER — Telehealth: Payer: Self-pay | Admitting: Family Medicine

## 2015-11-15 ENCOUNTER — Ambulatory Visit (INDEPENDENT_AMBULATORY_CARE_PROVIDER_SITE_OTHER): Payer: BLUE CROSS/BLUE SHIELD | Admitting: Family Medicine

## 2015-11-15 VITALS — BP 128/74 | HR 82 | Temp 98.8°F | Resp 14 | Ht 66.0 in | Wt 167.0 lb

## 2015-11-15 DIAGNOSIS — Z1239 Encounter for other screening for malignant neoplasm of breast: Secondary | ICD-10-CM

## 2015-11-15 DIAGNOSIS — J069 Acute upper respiratory infection, unspecified: Secondary | ICD-10-CM | POA: Diagnosis not present

## 2015-11-15 DIAGNOSIS — G4700 Insomnia, unspecified: Secondary | ICD-10-CM

## 2015-11-15 DIAGNOSIS — Z124 Encounter for screening for malignant neoplasm of cervix: Secondary | ICD-10-CM | POA: Diagnosis not present

## 2015-11-15 DIAGNOSIS — Z0000 Encounter for general adult medical examination without abnormal findings: Secondary | ICD-10-CM

## 2015-11-15 DIAGNOSIS — F5104 Psychophysiologic insomnia: Secondary | ICD-10-CM | POA: Insufficient documentation

## 2015-11-15 DIAGNOSIS — Z113 Encounter for screening for infections with a predominantly sexual mode of transmission: Secondary | ICD-10-CM | POA: Diagnosis not present

## 2015-11-15 LAB — LIPID PANEL
Cholesterol: 187 mg/dL (ref 125–200)
HDL: 68 mg/dL (ref >=46)
LDL Cholesterol: 106 mg/dL (ref <130)
Total CHOL/HDL Ratio: 2.8 Ratio (ref <=5.0)
Triglycerides: 65 mg/dL (ref <150)
VLDL: 13 mg/dL (ref <30)

## 2015-11-15 LAB — WET PREP FOR TRICH, YEAST, CLUE
Clue Cells Wet Prep HPF POC: NONE SEEN
Trich, Wet Prep: NONE SEEN
Yeast Wet Prep HPF POC: NONE SEEN

## 2015-11-15 LAB — CBC WITH DIFFERENTIAL/PLATELET
Basophils Absolute: 0 cells/uL (ref 0–200)
Basophils Relative: 0 %
Eosinophils Absolute: 336 cells/uL (ref 15–500)
Eosinophils Relative: 3 %
HCT: 39.3 % (ref 35.0–45.0)
Hemoglobin: 12.4 g/dL (ref 12.0–15.0)
Lymphocytes Relative: 10 %
Lymphs Abs: 1120 cells/uL (ref 850–3900)
MCH: 26.5 pg — ABNORMAL LOW (ref 27.0–33.0)
MCHC: 31.6 g/dL — ABNORMAL LOW (ref 32.0–36.0)
MCV: 84 fL (ref 80.0–100.0)
MPV: 9.7 fL (ref 7.5–12.5)
Monocytes Absolute: 672 cells/uL (ref 200–950)
Monocytes Relative: 6 %
Neutro Abs: 9072 cells/uL — ABNORMAL HIGH (ref 1500–7800)
Neutrophils Relative %: 81 %
Platelets: 297 10*3/uL (ref 140–400)
RBC: 4.68 MIL/uL (ref 3.80–5.10)
RDW: 16.6 % — ABNORMAL HIGH (ref 11.0–15.0)
WBC: 11.2 10*3/uL — ABNORMAL HIGH (ref 3.8–10.8)

## 2015-11-15 LAB — COMPREHENSIVE METABOLIC PANEL
ALT: 60 U/L — ABNORMAL HIGH (ref 6–29)
AST: 38 U/L — ABNORMAL HIGH (ref 10–35)
Albumin: 4.4 g/dL (ref 3.6–5.1)
Alkaline Phosphatase: 63 U/L (ref 33–115)
BUN: 14 mg/dL (ref 7–25)
CO2: 27 mmol/L (ref 20–31)
Calcium: 9.1 mg/dL (ref 8.6–10.2)
Chloride: 104 mmol/L (ref 98–110)
Creat: 0.68 mg/dL (ref 0.50–1.10)
Glucose, Bld: 93 mg/dL (ref 70–99)
Potassium: 4.4 mmol/L (ref 3.5–5.3)
Sodium: 139 mmol/L (ref 135–146)
Total Bilirubin: 0.2 mg/dL (ref 0.2–1.2)
Total Protein: 7.4 g/dL (ref 6.1–8.1)

## 2015-11-15 MED ORDER — GUAIFENESIN-CODEINE 100-10 MG/5ML PO SOLN: 5.0000 mL | Freq: Four times a day (QID) | ORAL | 0 refills | Status: DC | PRN

## 2015-11-15 NOTE — Patient Instructions (Addendum)
Schedule your mammogram  We will call with lab results Get flu shot in about 2 weeks Cough medicine given  F/U as needed or in 1 year
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
## 2015-11-15 NOTE — Telephone Encounter (Signed)
Medication was called to [facility].   Matt, pharmacist was spoken with in regards to clarification on VM.

## 2015-11-15 NOTE — Assessment & Plan Note (Signed)
Continue trazodone as needed. 

## 2015-11-15 NOTE — Telephone Encounter (Signed)
Ms. Surgeon called saying the Rx for guaiFENesin-codeine 100-10 MG/5ML syrup wasn't sent to her pharmacy. In the system, it looks as though the Rx was printed here. She's wondering if it can be sent to the pharmacy noted in her chart. Please give her a call if needed.  Pt's ph# [phone]  Thank you.

## 2015-11-16 LAB — GC/CHLAMYDIA PROBE AMP
CT Probe RNA: NOT DETECTED
GC Probe RNA: NOT DETECTED

## 2015-11-16 LAB — HIV ANTIBODY (ROUTINE TESTING W REFLEX): HIV 1&2 Ab, 4th Generation: NONREACTIVE

## 2015-11-19 LAB — PAP THINPREP ASCUS RFLX HPV RFLX TYPE

## 2015-11-22 ENCOUNTER — Other Ambulatory Visit: Payer: Self-pay | Admitting: *Deleted

## 2015-11-22 DIAGNOSIS — D72828 Other elevated white blood cell count: Secondary | ICD-10-CM

## 2015-11-22 DIAGNOSIS — R7989 Other specified abnormal findings of blood chemistry: Secondary | ICD-10-CM

## 2015-11-22 DIAGNOSIS — R945 Abnormal results of liver function studies: Principal | ICD-10-CM

## 2015-12-04 ENCOUNTER — Ambulatory Visit (INDEPENDENT_AMBULATORY_CARE_PROVIDER_SITE_OTHER): Payer: BLUE CROSS/BLUE SHIELD | Admitting: Family Medicine

## 2015-12-04 ENCOUNTER — Other Ambulatory Visit: Payer: Self-pay | Admitting: Family Medicine

## 2015-12-04 ENCOUNTER — Other Ambulatory Visit: Payer: BLUE CROSS/BLUE SHIELD

## 2015-12-04 DIAGNOSIS — R945 Abnormal results of liver function studies: Principal | ICD-10-CM

## 2015-12-04 DIAGNOSIS — Z23 Encounter for immunization: Secondary | ICD-10-CM

## 2015-12-04 DIAGNOSIS — R7989 Other specified abnormal findings of blood chemistry: Secondary | ICD-10-CM

## 2015-12-04 DIAGNOSIS — D72828 Other elevated white blood cell count: Secondary | ICD-10-CM

## 2015-12-05 LAB — CBC WITH DIFFERENTIAL/PLATELET
Basophils Absolute: 66 cells/uL (ref 0–200)
Basophils Relative: 1 %
Eosinophils Absolute: 396 cells/uL (ref 15–500)
Eosinophils Relative: 6 %
HCT: 38.2 % (ref 35.0–45.0)
Hemoglobin: 12.1 g/dL (ref 12.0–15.0)
Lymphocytes Relative: 30 %
Lymphs Abs: 1980 cells/uL (ref 850–3900)
MCH: 26.4 pg — ABNORMAL LOW (ref 27.0–33.0)
MCHC: 31.7 g/dL — ABNORMAL LOW (ref 32.0–36.0)
MCV: 83.2 fL (ref 80.0–100.0)
MPV: 10.1 fL (ref 7.5–12.5)
Monocytes Absolute: 396 cells/uL (ref 200–950)
Monocytes Relative: 6 %
Neutro Abs: 3762 cells/uL (ref 1500–7800)
Neutrophils Relative %: 57 %
Platelets: 352 10*3/uL (ref 140–400)
RBC: 4.59 MIL/uL (ref 3.80–5.10)
RDW: 16.4 % — ABNORMAL HIGH (ref 11.0–15.0)
WBC: 6.6 10*3/uL (ref 3.8–10.8)

## 2015-12-05 LAB — HEPATITIS PANEL, ACUTE
HCV Ab: NEGATIVE
Hep A IgM: NONREACTIVE
Hep B C IgM: NONREACTIVE
Hepatitis B Surface Ag: NEGATIVE

## 2015-12-06 LAB — HEPATIC FUNCTION PANEL
AG Ratio: 1.2 Ratio (ref 1.0–2.5)
ALT: 13 U/L (ref 6–29)
AST: 15 U/L (ref 10–35)
Albumin: 4.1 g/dL (ref 3.6–5.1)
Alkaline Phosphatase: 52 U/L (ref 33–115)
Bilirubin, Direct: 0.1 mg/dL (ref <=0.2)
Globulin: 3.3 g/dL (ref 1.9–3.7)
Indirect Bilirubin: 0.1 mg/dL — ABNORMAL LOW (ref 0.2–1.2)
Total Bilirubin: 0.2 mg/dL (ref 0.2–1.2)
Total Protein: 7.4 g/dL (ref 6.1–8.1)

## 2015-12-12 ENCOUNTER — Ambulatory Visit (HOSPITAL_COMMUNITY): Payer: BLUE CROSS/BLUE SHIELD

## 2015-12-20 ENCOUNTER — Ambulatory Visit: Payer: BLUE CROSS/BLUE SHIELD

## 2016-01-03 ENCOUNTER — Ambulatory Visit
Admission: RE | Admit: 2016-01-03 | Discharge: 2016-01-03 | Disposition: A | Discharge status: Home / Self Care | Payer: BLUE CROSS/BLUE SHIELD | Source: Ambulatory Visit | Attending: Family Medicine | Admitting: Family Medicine

## 2016-01-03 DIAGNOSIS — Z1239 Encounter for other screening for malignant neoplasm of breast: Secondary | ICD-10-CM

## 2016-01-08 ENCOUNTER — Other Ambulatory Visit: Payer: Self-pay | Admitting: Family Medicine

## 2016-03-23 ENCOUNTER — Ambulatory Visit (INDEPENDENT_AMBULATORY_CARE_PROVIDER_SITE_OTHER): Payer: BLUE CROSS/BLUE SHIELD | Admitting: Physician Assistant

## 2016-03-23 ENCOUNTER — Encounter: Payer: Self-pay | Admitting: Physician Assistant

## 2016-03-23 VITALS — BP 126/86 | HR 90 | Temp 98.3°F | Resp 16 | Wt 180.2 lb

## 2016-03-23 DIAGNOSIS — J988 Other specified respiratory disorders: Secondary | ICD-10-CM | POA: Diagnosis not present

## 2016-03-23 DIAGNOSIS — B9789 Other viral agents as the cause of diseases classified elsewhere: Secondary | ICD-10-CM | POA: Diagnosis not present

## 2016-03-23 NOTE — Progress Notes (Signed)
    Patient ID: [name] Fernandez MRN: [id number], DOB: Feb 11, 1971, 46 y.o. Date of Encounter: 03/23/2016, 8:47 AM    Chief Complaint:  Chief Complaint  Patient presents with  . Nasal Congestion    x3days  . Sore Throat  . Headache     HPI: 46 y.o. year old female presents with above.   Reports that symptoms started Friday 03/20/16. Says that she is having a lot of sneezing, ears popping, and headache. Says she blows her nose gets drainage that the little bit yellow. States that Friday temperature was 99.9 and that is the highest temperature she has had. Has been using some over-the-counter decongestant medicine and taking ibuprofen for headache.     Home Meds:   Outpatient Medications Prior to Visit  Medication Sig Dispense Refill  . traZODone (DESYREL) 50 MG tablet TAKE ONE TABLET BY MOUTH AT BEDTIME AS NEEDED FOR SLEEP 30 tablet 2  . guaiFENesin-codeine 100-10 MG/5ML syrup Take 5 mLs by mouth every 6 (six) hours as needed for cough. (Patient not taking: Reported on 03/23/2016) 140 mL 0   No facility-administered medications prior to visit.     Allergies:  Allergies  Allergen Reactions  . Sulfa Antibiotics     Nausea       Review of Systems: See HPI for pertinent ROS. All other ROS negative.    Physical Exam: Blood pressure 126/86, pulse 90, temperature 98.3 F (36.8 C), temperature source Oral, resp. rate 16, weight 180 lb 3.2 oz (81.7 kg), last menstrual period 02/22/2016, SpO2 99 %., Body mass index is 29.09 kg/m. General:  WNWD WF. Appears in no acute distress. HEENT: Normocephalic, atraumatic, eyes without discharge, sclera non-icteric, nares are without discharge. Bilateral auditory canals clear, TM's are without perforation, pearly grey and translucent with reflective cone of light bilaterally. Oral cavity moist, posterior pharynx without exudate, erythema, peritonsillar abscess. No tenderness with percussion to frontal or maxillary sinuses bilaterally.  Neck:  Supple. No thyromegaly. No lymphadenopathy. Lungs: Clear bilaterally to auscultation without wheezes, rales, or rhonchi. Breathing is unlabored. Heart: Regular rhythm. No murmurs, rubs, or gallops. Msk:  Strength and tone normal for age. Extremities/Skin: Warm and dry.  Neuro: Alert and oriented X 3. Moves all extremities spontaneously. Gait is normal. CNII-XII grossly in tact. Psych:  Responds to questions appropriately with a normal affect.     ASSESSMENT AND PLAN:  46 y.o. year old female with  1. Viral respiratory infection She is to continue the ibuprofen for headaches and body aches. Can use over-the-counter medications for decongestant and cough symptom relief. Offered her note for work but states that she does not need note. Told her to call and follow-up if develops recurrent fever or increased fever or symptoms worsen significantly or if symptoms do not resolve after 7-10 days.   Marin Olp [place], [place], [facility] 03/23/2016 8:47 AM

## 2016-03-25 ENCOUNTER — Other Ambulatory Visit: Payer: Self-pay | Admitting: Family Medicine

## 2016-03-26 ENCOUNTER — Telehealth: Payer: Self-pay | Admitting: Family Medicine

## 2016-03-26 NOTE — Telephone Encounter (Signed)
Patient states she came in on Monday her throat is continuing to hurt with a cough and her headache has now turned into like a Migraine. She would like to know if something could be called into [facility] at [facility]  CB# [phone]

## 2016-03-27 MED ORDER — AZITHROMYCIN 250 MG PO TABS: ORAL_TABLET | ORAL | 0 refills | Status: DC

## 2016-03-27 NOTE — Telephone Encounter (Signed)
rx sent to pharmacy

## 2016-03-27 NOTE — Telephone Encounter (Signed)
Keep taking robitussin codiene Add claritin or zyrtec for drainage, can also use OTC nasal spray  Send in zpak

## 2016-03-27 NOTE — Telephone Encounter (Signed)
Pt was seen by [name] Fernandez on 2-5 and states she is not feeling any better. Pt states she has taking OTC medication for her cold as well as Ibuprofen for her headache like [name] Fernandez recommended  Pt is req that you call something in for her she stated she did not want [name] Fernandez treating her anymore.Explained what [name] Fernandez said she could be taking to clear up her symptoms.  Pls advise

## 2016-05-20 ENCOUNTER — Encounter: Payer: Self-pay | Admitting: Family Medicine

## 2016-06-16 ENCOUNTER — Encounter: Payer: Self-pay | Admitting: Family Medicine

## 2016-06-16 ENCOUNTER — Other Ambulatory Visit: Payer: Self-pay | Admitting: Family Medicine

## 2016-09-08 ENCOUNTER — Other Ambulatory Visit: Payer: Self-pay | Admitting: Family Medicine

## 2016-11-13 ENCOUNTER — Ambulatory Visit (INDEPENDENT_AMBULATORY_CARE_PROVIDER_SITE_OTHER): Payer: BLUE CROSS/BLUE SHIELD | Admitting: Family Medicine

## 2016-11-13 DIAGNOSIS — Z23 Encounter for immunization: Secondary | ICD-10-CM

## 2016-12-02 ENCOUNTER — Other Ambulatory Visit: Payer: Self-pay | Admitting: Family Medicine

## 2016-12-09 ENCOUNTER — Encounter (HOSPITAL_COMMUNITY): Payer: Self-pay | Admitting: Family Medicine

## 2016-12-09 ENCOUNTER — Ambulatory Visit (HOSPITAL_COMMUNITY)
Admission: EM | Admit: 2016-12-09 | Discharge: 2016-12-09 | Disposition: A | Discharge status: Home / Self Care | Payer: BLUE CROSS/BLUE SHIELD | Attending: Family Medicine | Admitting: Family Medicine

## 2016-12-09 DIAGNOSIS — M79601 Pain in right arm: Secondary | ICD-10-CM

## 2016-12-09 MED ORDER — MELOXICAM 7.5 MG PO TABS: 7.5000 mg | ORAL_TABLET | Freq: Every day | ORAL | 0 refills | Status: DC

## 2016-12-09 MED ORDER — PREDNISONE 20 MG PO TABS
40.0000 mg | ORAL_TABLET | Freq: Every day | ORAL | 0 refills | Status: AC
Start: 2016-12-09 — End: 2016-12-13

## 2016-12-09 NOTE — ED Triage Notes (Signed)
Pt here for right elbow pain more with movement and slight touch. Denies injury. Reports also worse with writing or typing.

## 2016-12-09 NOTE — Discharge Instructions (Signed)
Symptoms most consistent with inflammation such as tendinitis/bursitis. Start mobic and prednisone as directed. Ice compress and rest. This can take up to 3-4 weeks to completely resolve, but you should be feeling better each week. Follow up with PCP/orthopedics if symptoms worsen or does not improve.

## 2016-12-09 NOTE — ED Provider Notes (Signed)
[name]    CSN: [id number] Arrival date & time: 12/09/16  1507     History   Chief Complaint Chief Complaint  Patient presents with  . Arm Pain    HPI [name] Fernandez is a 46 y.o. female.   46 year old female with history of nephrolithiasis comes in for 2 week history of right elbow pain. States pain was first intermittent, and now is constant with aching sensation. Pain is worse with movement/writing/typing and slight touch. Denies injury. Has had intermittent numbness/tingling. She has tried ibuprofen 800mg  Q4-6H, naproxen, tylenol without relief. 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 Denies swelling, erythema, increased warmth. Denies fever, chills, night sweats.       Past Medical History:  Diagnosis Date  . Renal disorder    kidney stones    Patient Active Problem List   Diagnosis Date Noted  . Chronic insomnia 11/15/2015  . Renal disorder     Past Surgical History:  Procedure Laterality Date  . CESAREAN SECTION  2015  . FRACTURE SURGERY    . OPEN REDUCTION INTERNAL FIXATION (ORIF) METACARPAL Right 02/07/2015   Procedure: OPEN REDUCTION INTERNAL FIXATION (ORIF) RIGHT INDEX METACARPAL;  Surgeon: Leanora Cover, MD;  Location: [facility];  Service: Orthopedics;  Laterality: Right;  . TUBAL LIGATION    . UTERINE STENT PLACEMENT      OB History    No data available       Home Medications    Prior to Admission medications   Medication Sig Start Date End Date Taking? Authorizing Provider  azithromycin (ZITHROMAX) 250 MG tablet Day 1 take 2 tablets, days 2-5 take 1 tablet 03/27/16   [place], Modena Nunnery, MD  guaiFENesin-codeine 100-10 MG/5ML syrup Take 5 mLs by mouth every 6 (six) hours as needed for cough. Patient not taking: Reported on 03/23/2016 11/15/15   Alycia Rossetti, MD  meloxicam (MOBIC) 7.5 MG tablet Take 1 tablet (7.5 mg total) by mouth daily. 12/09/16   Tasia Catchings,  Female Minish V, PA-C  predniSONE (DELTASONE) 20 MG tablet Take 2 tablets (40 mg total) by mouth daily. 12/09/16 12/13/16  Ok Edwards, PA-C  traZODone (DESYREL) 50 MG tablet TAKE 1 TABLET BY MOUTH AT BEDTIME AS NEEDED FOR SLEEP 12/03/16   Alycia Rossetti, MD    Family History Family History  Problem Relation Age of Onset  . Arthritis Father        rheumatoid  . COPD Father   . Cancer Sister        UTERINE CANCER     Social History Social History  Substance Use Topics  . Smoking status: Former Smoker    Packs/day: 0.25    Types: Cigarettes  . Smokeless tobacco: Never Used  . Alcohol use No     Allergies   Sulfa antibiotics   Review of Systems Review of Systems  Reason unable to perform ROS: See HPI as above.     Physical Exam Triage Vital Signs ED Triage Vitals [12/09/16 1530]  Enc Vitals Group     BP (!) 145/84     Pulse Rate 87     Resp 18     Temp 98.6 F (37 C)     Temp src      SpO2 100 %     Weight      Height      Head Circumference  Peak Flow      Pain Score 6     Pain Loc      Pain Edu?      Excl. in [place]?    No data found.   Updated Vital Signs BP (!) 145/84   Pulse 87   Temp 98.6 F (37 C)   Resp 18   SpO2 100%   Physical Exam  Constitutional: She is oriented to person, place, and time. She appears well-developed and well-nourished. No distress.  HENT:  Head: Normocephalic and atraumatic.  Eyes: Pupils are equal, round, and reactive to light. Conjunctivae are normal.  Neck: Normal range of motion. Neck supple. No spinous process tenderness present. Normal range of motion present.  Cardiovascular: Normal rate, regular rhythm and normal heart sounds.  Exam reveals no gallop and no friction rub.   No murmur heard. Pulmonary/Chest: Effort normal and breath sounds normal. She has no wheezes. She has no rales.  Musculoskeletal:  No swelling, erythema, increased warmth. No tenderness on palpation of the shoulder/elbow/wrist. Full range of  motion of shoulder, elbow, wirst. Strength normal and equal bilaterally. Sensation slightly decreased in right 5th digit, otherwise intact and equal.   Radial pulses 2+ and equal bilaterally. Capillary refill less than 2 seconds.   Neurological: She is alert and oriented to person, place, and time.  Skin: Skin is warm and dry.     UC Treatments / Results  Labs (all labs ordered are listed, but only abnormal results are displayed) Labs Reviewed - No data to display  EKG  EKG Interpretation None       Radiology No results found.  Procedures Procedures (including critical care time)  Medications Ordered in UC Medications - No data to display   Initial Impression / Assessment and Plan / UC Course  I have reviewed the triage vital signs and the nursing notes.  Pertinent labs & imaging results that were available during my care of the patient were reviewed by me and considered in my medical decision making (see chart for details).    Discussed with patient possible inflammatory process. Given patient has tried NSAIDs without relief, will start Mobic with prednisone. Discussed RICE. Patient to follow up with PCP or orthopedics if symptoms not improving, or worsens.   Final Clinical Impressions(s) / UC Diagnoses   Final diagnoses:  Right arm pain    New Prescriptions New Prescriptions   MELOXICAM (MOBIC) 7.5 MG TABLET    Take 1 tablet (7.5 mg total) by mouth daily.   PREDNISONE (DELTASONE) 20 MG TABLET    Take 2 tablets (40 mg total) by mouth daily.      Ok Edwards, PA-C 12/09/16 1609

## 2017-02-26 ENCOUNTER — Telehealth: Payer: Self-pay | Admitting: Family Medicine

## 2017-02-26 MED ORDER — TRAZODONE HCL 50 MG PO TABS: 50.0000 mg | ORAL_TABLET | Freq: Every evening | ORAL | 0 refills | Status: DC | PRN

## 2017-02-26 NOTE — Telephone Encounter (Signed)
[facility]  Patient is calling  to get refill on her trazadone, needs it sent to the new pharmacy above  [phone] if any questions

## 2017-02-26 NOTE — Telephone Encounter (Signed)
Medication filled x1 with no refills.   Requires office visit before any further refills can be given.  

## 2017-03-10 ENCOUNTER — Ambulatory Visit: Payer: BLUE CROSS/BLUE SHIELD | Admitting: Family Medicine

## 2017-03-26 ENCOUNTER — Ambulatory Visit: Payer: BLUE CROSS/BLUE SHIELD | Admitting: Family Medicine

## 2017-03-26 ENCOUNTER — Telehealth: Payer: Self-pay | Admitting: Family Medicine

## 2017-03-26 MED ORDER — TRAZODONE HCL 50 MG PO TABS: 50.0000 mg | ORAL_TABLET | Freq: Every evening | ORAL | 0 refills | Status: DC | PRN

## 2017-03-26 NOTE — Telephone Encounter (Signed)
Patient appointment had to be re-scheduled.   30 day supply sent to pharmacy.

## 2017-03-26 NOTE — Telephone Encounter (Signed)
[facility]  Patient needs refill on her trazadone

## 2017-03-29 ENCOUNTER — Other Ambulatory Visit: Payer: Self-pay | Admitting: Family Medicine

## 2017-04-09 ENCOUNTER — Other Ambulatory Visit: Payer: Self-pay

## 2017-04-09 ENCOUNTER — Encounter: Payer: Self-pay | Admitting: Family Medicine

## 2017-04-09 ENCOUNTER — Ambulatory Visit: Payer: BLUE CROSS/BLUE SHIELD | Admitting: Family Medicine

## 2017-04-09 VITALS — BP 122/68 | HR 82 | Temp 98.1°F | Resp 16 | Ht 66.0 in | Wt 192.0 lb

## 2017-04-09 DIAGNOSIS — F439 Reaction to severe stress, unspecified: Secondary | ICD-10-CM

## 2017-04-09 DIAGNOSIS — F5104 Psychophysiologic insomnia: Secondary | ICD-10-CM

## 2017-04-09 MED ORDER — TRAZODONE HCL 100 MG PO TABS: 100.0000 mg | ORAL_TABLET | Freq: Every evening | ORAL | 11 refills | Status: DC | PRN

## 2017-04-09 NOTE — Patient Instructions (Signed)
F/U as needed

## 2017-04-09 NOTE — Progress Notes (Signed)
   Subjective:    Patient ID: [name] Fernandez, female    DOB: 08-06-1970, 47 y.o.   MRN: [id number]  Patient presents for Medication Review/ Refill (is not fasting) Pt here to f/u on insomnia and anxiety. She recently loss her insurance as ex husband had change in job status. She is still trying to fight him for custody and is concerned about the medications she may be on he will try to use against her in court. She is still working but not sleeping well and things have been stressful the past few weeks with the financial changes. She does not feel like the trazodone 50mg  is strong enough. Would like to try stronger dose. No side effects with current medication. She has some friends, but does not want to get to close to anyone out of fear her ex will use them in court against her    Review Of Systems:  GEN- denies fatigue, fever, weight loss,weakness, recent illness HEENT- denies eye drainage, change in vision, nasal discharge, CVS- denies chest pain, palpitations RESP- denies SOB, cough, wheeze ABD- denies N/V, change in stools, abd pain GU- denies dysuria, hematuria, dribbling, incontinence MSK- denies joint pain, muscle aches, injury Neuro- denies headache, dizziness, syncope, seizure activity       Objective:    BP 122/68   Pulse 82   Temp 98.1 F (36.7 C) (Temporal)   Resp 16   Ht 5\' 6"  (1.676 m)   Wt 192 lb (87.1 kg)   SpO2 98%   BMI 30.99 kg/m  GEN- NAD, alert and oriented x3 HEENT- PERRL, EOMI, non injected sclera, pink conjunctiva, MMM, oropharynx clear Neck- Supple, no thyromegaly CVS- RRR, no murmur RESP-CTAB Psych- tearful at times, not anxious appearing, well groomed, normal speech, no SI       Assessment & Plan:      Problem List Items Addressed This Visit      Unprioritized   Chronic insomnia - Primary   Stress at home    Very difficult situation, she is working but little financial help and now loss of insurane for herself and daughter. Will get  her set up with office manager to work out details, to continue have them being seen here at the office Increase trazodone to 100mg  at bedtime, she prefers to avoid some of the other medications which is very reasonable         Note: This dictation was prepared with Dragon dictation along with smaller phrase technology. Any transcriptional errors that result from this process are unintentional.

## 2017-04-11 DIAGNOSIS — F439 Reaction to severe stress, unspecified: Secondary | ICD-10-CM | POA: Insufficient documentation

## 2017-04-11 NOTE — Assessment & Plan Note (Signed)
Very difficult situation, she is working but little financial help and now loss of insurane for herself and daughter. Will get her set up with office manager to work out details, to continue have them being seen here at the office Increase trazodone to 100mg  at bedtime, she prefers to avoid some of the other medications which is very reasonable

## 2017-04-28 ENCOUNTER — Other Ambulatory Visit: Payer: Self-pay | Admitting: *Deleted

## 2017-04-28 ENCOUNTER — Other Ambulatory Visit: Payer: Self-pay | Admitting: Family Medicine

## 2017-04-28 MED ORDER — TRAZODONE HCL 100 MG PO TABS: 100.0000 mg | ORAL_TABLET | Freq: Every evening | ORAL | 11 refills | Status: DC | PRN

## 2018-04-01 ENCOUNTER — Other Ambulatory Visit: Payer: Self-pay | Admitting: Family Medicine

## 2018-08-27 DIAGNOSIS — F329 Major depressive disorder, single episode, unspecified: Secondary | ICD-10-CM | POA: Diagnosis not present

## 2018-09-08 ENCOUNTER — Other Ambulatory Visit: Payer: Self-pay

## 2018-09-09 ENCOUNTER — Ambulatory Visit (INDEPENDENT_AMBULATORY_CARE_PROVIDER_SITE_OTHER): Payer: BC Managed Care – PPO | Admitting: Family Medicine

## 2018-09-09 ENCOUNTER — Encounter: Payer: Self-pay | Admitting: Family Medicine

## 2018-09-09 VITALS — BP 124/62 | HR 76 | Temp 98.9°F | Resp 14 | Ht 66.0 in | Wt 211.0 lb

## 2018-09-09 DIAGNOSIS — M79671 Pain in right foot: Secondary | ICD-10-CM

## 2018-09-09 DIAGNOSIS — Z113 Encounter for screening for infections with a predominantly sexual mode of transmission: Secondary | ICD-10-CM

## 2018-09-09 DIAGNOSIS — Z0000 Encounter for general adult medical examination without abnormal findings: Secondary | ICD-10-CM

## 2018-09-09 DIAGNOSIS — Z124 Encounter for screening for malignant neoplasm of cervix: Secondary | ICD-10-CM

## 2018-09-09 DIAGNOSIS — F5104 Psychophysiologic insomnia: Secondary | ICD-10-CM

## 2018-09-09 DIAGNOSIS — Z0001 Encounter for general adult medical examination with abnormal findings: Secondary | ICD-10-CM

## 2018-09-09 DIAGNOSIS — M25471 Effusion, right ankle: Secondary | ICD-10-CM

## 2018-09-09 DIAGNOSIS — Z7251 High risk heterosexual behavior: Secondary | ICD-10-CM | POA: Diagnosis not present

## 2018-09-09 DIAGNOSIS — Z1239 Encounter for other screening for malignant neoplasm of breast: Secondary | ICD-10-CM

## 2018-09-09 DIAGNOSIS — Z202 Contact with and (suspected) exposure to infections with a predominantly sexual mode of transmission: Secondary | ICD-10-CM | POA: Diagnosis not present

## 2018-09-09 DIAGNOSIS — Z13220 Encounter for screening for lipoid disorders: Secondary | ICD-10-CM | POA: Diagnosis not present

## 2018-09-09 DIAGNOSIS — E669 Obesity, unspecified: Secondary | ICD-10-CM

## 2018-09-09 DIAGNOSIS — F418 Other specified anxiety disorders: Secondary | ICD-10-CM

## 2018-09-09 LAB — WET PREP FOR TRICH, YEAST, CLUE

## 2018-09-09 MED ORDER — TRAZODONE HCL 100 MG PO TABS: 100.0000 mg | ORAL_TABLET | Freq: Every evening | ORAL | 3 refills | Status: DC | PRN

## 2018-09-09 NOTE — Assessment & Plan Note (Signed)
Continue trazodone 

## 2018-09-09 NOTE — Progress Notes (Signed)
Subjective:    Patient ID: [name] Fernandez, female    DOB: Dec 06, 1970, 48 y.o.   MRN: [id number]  Patient presents for Gynecologic Exam (is fasting) and R Foot Pain (x months- progressively worsening pain since fall in 11/2017)  Patient here for complete physical exam.  Medications and history reviewed.  She is currently on trazodone 100 mg at bedtime as needed for sleep. She is due for mammogram Immunizations  her tetanus booster is up-to-date Due for Pap smear last done in 2017 no history of any abnormal Due for fasting labs   Right foot pain- since Oct 2019, she fell off a step on a deck and twisted her foot, since then has had chronic swelling in lateral malleolus, pain in both heels on and off. She did have bruising near base of 4th and 5th digit right after incident  Weight up 19lbs since last Feb, She admits to stress eating. She is still going through divorce, having custody and child support issues, feels husband just wont let go and is playing games with her.  She has been having panic attacks , gets upset and anxious at that entire situation. She has been seeing a therapist recently. Taking trazodone for sleep Today she found out he still has not put insurance on their daughter, which she has bee asking about for months   Menses regulary- LMP 7/10  Review Of Systems:  GEN- denies fatigue, fever, weight loss,weakness, recent illness HEENT- denies eye drainage, change in vision, nasal discharge, CVS- denies chest pain, palpitations RESP- denies SOB, cough, wheeze ABD- denies N/V, change in stools, abd pain GU- denies dysuria, hematuria, dribbling, incontinence MSK- + joint pain, muscle aches, injury Neuro- denies headache, dizziness, syncope, seizure activity       Objective:    BP 124/62   Pulse 76   Temp 98.9 F (37.2 C) (Oral)   Resp 14   Ht 5\' 6"  (1.676 m)   Wt 211 lb (95.7 kg)   LMP 08/31/2018 Comment: regular  SpO2 99%   BMI 34.06 kg/m  GEN- NAD, alert  and oriented x3 HEENT- PERRL, EOMI, non injected sclera, pink conjunctiva, MMM, oropharynx clear Neck- Supple, no thyromegaly Breast- normal symmetry, no nipple inversion,no nipple drainage, no nodules or lumps felt Nodes- no axillary nodes CVS- RRR, no murmur RESP-CTAB ABD-NABS,soft,NT,ND GU- normal external genitalia, vaginal mucosa pink and moist, cervix visualized no growth, + blood form os, minimal thin clear discharge, no CMT, no ovarian masses, uterus normal size EXT- No edema MSK- Mild swelling right lateral malleolus, pain with inversion of ankle-right, otherwise normal FROM, neg squeeze test, heels NT bilat, FROM left foot/ankle Psych- tearful discussing situation, well groomed, good eye contact, normal speech, not anxious appearing Pulses- Radial, DP- 2+        Assessment & Plan:      Problem List Items Addressed This Visit      Unprioritized   Chronic insomnia    Continue trazodone       Other Visit Diagnoses    Routine general medical examination at a health care facility    -  Primary   CPE Done, PAP , pt to schedule mammogram, fasting labs   Relevant Orders   CBC with Differential/Platelet   Comprehensive metabolic panel   Lipid panel   Cervical cancer screening       Relevant Orders   Pap IG w/ reflex to HPV when ASC-U   Breast cancer screening  Relevant Orders   MM 3D SCREEN BREAST BILATERAL   Screen for STD (sexually transmitted disease)       Relevant Orders   HIV Antibody (routine testing w rflx)   RPR   C. trachomatis/N. gonorrhoeae RNA   WET PREP FOR [place], YEAST, CLUE (Completed)   Right foot pain       Likley had bad sprain that she may be chronically irritating, for heel pain, plantar fascitis or heel spurs, she will call poditary, recommend imaging/ evaluat   Right ankle swelling       Situational anxiety       recommend she continue therapy, having panic attacks at tmes, discussed psychiatry , but she wants to hold off and see how  therapy goes which is very reasonable, she has a uphill battle with her divorce and custody issues   Relevant Medications   traZODone (DESYREL) 100 MG tablet    OBESITY- work on dietary changes, discussed exercise for stress relief and overall health    Note: This dictation was prepared with Diplomatic Services operational officer dictation along with smaller Company secretary. Any transcriptional errors that result from this process are unintentional.

## 2018-09-09 NOTE — Patient Instructions (Signed)
Schedule with podiatry  We will call with lab results Mammogram - call and schedule  F/U pending results

## 2018-09-10 LAB — C. TRACHOMATIS/N. GONORRHOEAE RNA
C. trachomatis RNA, TMA: NOT DETECTED
N. gonorrhoeae RNA, TMA: NOT DETECTED

## 2018-09-12 LAB — CBC WITH DIFFERENTIAL/PLATELET
Absolute Monocytes: 511 cells/uL (ref 200–950)
Basophils Absolute: 118 cells/uL (ref 0–200)
Basophils Relative: 1.6 %
Eosinophils Absolute: 311 cells/uL (ref 15–500)
Eosinophils Relative: 4.2 %
HCT: 35.9 % (ref 35.0–45.0)
Hemoglobin: 11.3 g/dL — ABNORMAL LOW (ref 11.7–15.5)
Lymphs Abs: 1887 cells/uL (ref 850–3900)
MCH: 24.4 pg — ABNORMAL LOW (ref 27.0–33.0)
MCHC: 31.5 g/dL — ABNORMAL LOW (ref 32.0–36.0)
MCV: 77.4 fL — ABNORMAL LOW (ref 80.0–100.0)
MPV: 10 fL (ref 7.5–12.5)
Monocytes Relative: 6.9 %
Neutro Abs: 4573 cells/uL (ref 1500–7800)
Neutrophils Relative %: 61.8 %
Platelets: 416 10*3/uL — ABNORMAL HIGH (ref 140–400)
RBC: 4.64 10*6/uL (ref 3.80–5.10)
RDW: 15 % (ref 11.0–15.0)
Total Lymphocyte: 25.5 %
WBC: 7.4 10*3/uL (ref 3.8–10.8)

## 2018-09-12 LAB — PAP IG W/ RFLX HPV ASCU

## 2018-09-12 LAB — COMPREHENSIVE METABOLIC PANEL
AG Ratio: 1.4 (calc) (ref 1.0–2.5)
ALT: 22 U/L (ref 6–29)
AST: 21 U/L (ref 10–35)
Albumin: 4.3 g/dL (ref 3.6–5.1)
Alkaline phosphatase (APISO): 84 U/L (ref 31–125)
BUN: 11 mg/dL (ref 7–25)
CO2: 26 mmol/L (ref 20–32)
Calcium: 9.3 mg/dL (ref 8.6–10.2)
Chloride: 102 mmol/L (ref 98–110)
Creat: 0.81 mg/dL (ref 0.50–1.10)
Globulin: 3 g/dL (calc) (ref 1.9–3.7)
Glucose, Bld: 86 mg/dL (ref 65–99)
Potassium: 4.6 mmol/L (ref 3.5–5.3)
Sodium: 139 mmol/L (ref 135–146)
Total Bilirubin: 0.3 mg/dL (ref 0.2–1.2)
Total Protein: 7.3 g/dL (ref 6.1–8.1)

## 2018-09-12 LAB — LIPID PANEL
Cholesterol: 211 mg/dL — ABNORMAL HIGH (ref <200)
HDL: 67 mg/dL (ref >=50)
LDL Cholesterol (Calc): 125 mg/dL (calc) — ABNORMAL HIGH
Non-HDL Cholesterol (Calc): 144 mg/dL (calc) — ABNORMAL HIGH (ref <130)
Total CHOL/HDL Ratio: 3.1 (calc) (ref <5.0)
Triglycerides: 88 mg/dL (ref <150)

## 2018-09-12 LAB — HIV ANTIBODY (ROUTINE TESTING W REFLEX): HIV 1&2 Ab, 4th Generation: NONREACTIVE

## 2018-09-12 LAB — RPR: RPR Ser Ql: NONREACTIVE

## 2018-09-13 ENCOUNTER — Telehealth: Payer: Self-pay | Admitting: Family Medicine

## 2018-09-13 NOTE — Telephone Encounter (Signed)
Please see labs for further information.  

## 2018-09-13 NOTE — Telephone Encounter (Signed)
Patient did not understand her lab results  Could you please call her and explain further [phone]

## 2018-11-02 DIAGNOSIS — F329 Major depressive disorder, single episode, unspecified: Secondary | ICD-10-CM | POA: Diagnosis not present

## 2018-11-10 DIAGNOSIS — H5213 Myopia, bilateral: Secondary | ICD-10-CM | POA: Diagnosis not present

## 2018-11-10 DIAGNOSIS — H524 Presbyopia: Secondary | ICD-10-CM | POA: Diagnosis not present

## 2018-12-07 DIAGNOSIS — F329 Major depressive disorder, single episode, unspecified: Secondary | ICD-10-CM | POA: Diagnosis not present

## 2018-12-07 DIAGNOSIS — F411 Generalized anxiety disorder: Secondary | ICD-10-CM | POA: Diagnosis not present

## 2019-02-27 DIAGNOSIS — Z3041 Encounter for surveillance of contraceptive pills: Secondary | ICD-10-CM | POA: Diagnosis not present

## 2019-02-27 DIAGNOSIS — Z113 Encounter for screening for infections with a predominantly sexual mode of transmission: Secondary | ICD-10-CM | POA: Diagnosis not present

## 2019-02-27 DIAGNOSIS — Z124 Encounter for screening for malignant neoplasm of cervix: Secondary | ICD-10-CM | POA: Diagnosis not present

## 2019-02-27 DIAGNOSIS — Z01419 Encounter for gynecological examination (general) (routine) without abnormal findings: Secondary | ICD-10-CM | POA: Diagnosis not present

## 2019-04-04 ENCOUNTER — Other Ambulatory Visit: Payer: Self-pay | Admitting: Family Medicine

## 2019-06-05 ENCOUNTER — Ambulatory Visit (INDEPENDENT_AMBULATORY_CARE_PROVIDER_SITE_OTHER): Payer: Self-pay | Admitting: Family Medicine

## 2019-06-05 ENCOUNTER — Encounter: Payer: Self-pay | Admitting: Family Medicine

## 2019-06-05 ENCOUNTER — Other Ambulatory Visit: Payer: Self-pay

## 2019-06-05 VITALS — BP 132/80 | HR 80 | Temp 98.6°F | Resp 14 | Ht 66.0 in | Wt 224.0 lb

## 2019-06-05 DIAGNOSIS — M546 Pain in thoracic spine: Secondary | ICD-10-CM

## 2019-06-05 DIAGNOSIS — D2230 Melanocytic nevi of unspecified part of face: Secondary | ICD-10-CM

## 2019-06-05 DIAGNOSIS — M62838 Other muscle spasm: Secondary | ICD-10-CM

## 2019-06-05 MED ORDER — METHOCARBAMOL 500 MG PO TABS: 500.0000 mg | ORAL_TABLET | Freq: Three times a day (TID) | ORAL | 0 refills | Status: DC | PRN

## 2019-06-05 MED ORDER — HYDROCODONE-ACETAMINOPHEN 5-325 MG PO TABS: 1.0000 | ORAL_TABLET | Freq: Four times a day (QID) | ORAL | 0 refills | Status: DC | PRN

## 2019-06-05 MED ORDER — MELOXICAM 7.5 MG PO TABS: 7.5000 mg | ORAL_TABLET | Freq: Every day | ORAL | 0 refills | Status: DC

## 2019-06-05 NOTE — Progress Notes (Signed)
   Subjective:    Patient ID: [name] Fernandez, female    DOB: 1970/11/02, 49 y.o.   MRN: [id number]  Patient presents for R Sided Lower Back Pain (x1 week- thinks it is a pulled muecle- no injury )   Pt here with right sided back pain, pain in mid back and radiates to low back for 1 week, it was getting better No particular injury  She wsa walking more this past weekend and pain worsened.  She used heating pad, and took tylenol, ibuprofen which didn't help  Hot bath helped a little Friday night  Has pain when she tries to reach behind or across  Yesterday had a little numbness in leg, but thought it was the way she was laying with pillow between her knees  No change in urination or bowel movements , but feels a little bloated   Nevus on face that is changing in size she would like to have this removed.  She does have family history of skin cancer.    Review Of Systems:  GEN- denies fatigue, fever, weight loss,weakness, recent illness HEENT- denies eye drainage, change in vision, nasal discharge, CVS- denies chest pain, palpitations RESP- denies SOB, cough, wheeze ABD- denies N/V, change in stools, abd pain GU- denies dysuria, hematuria, dribbling, incontinence MSK- denies joint pain, muscle aches, injury Neuro- denies headache, dizziness, syncope, seizure activity       Objective:    BP 132/80   Pulse 80   Temp 98.6 F (37 C) (Temporal)   Resp 14   Ht 5\' 6"  (1.676 m)   Wt 224 lb (101.6 kg)   SpO2 98%   BMI 36.15 kg/m  GEN- NAD, alert and oriented x3 CVS- RRR, no murmur RESP-CTAB ABD-NABS,soft,NT,ND MSK -  Spine nontender good range of motion, to palpation right paraspinals from the mid to lower thoracic region positive spasm negative straight leg raise full range of motion hips knees, pain with rotation Skin- elevated nevus with mild hyperpigmentation  -right cheek  NEURO- normal tone LE, sensation grossly ibn tact, strength in tact bilat LE  EXT- No edema Pulses-  Radial, DP- 2+        Assessment & Plan:      Problem List Items Addressed This Visit    None    Visit Diagnoses    Acute right-sided thoracic back pain    -  Primary   MSK pain/spasm, given robaxin TID prn, mobic daily x 2 weeks, norco at bedtime for severe pain, hold on imaging, no red flags Recommend she not lift more than 20 pounds for the next few weeks Okay to continue heat/ice   Relevant Medications   methocarbamol (ROBAXIN) 500 MG tablet   meloxicam (MOBIC) 7.5 MG tablet   HYDROcodone-acetaminophen (NORCO) 5-325 MG tablet   Muscle spasm       Nevus of face       Relevant Orders   Ambulatory referral to Dermatology      Note: This dictation was prepared with Dragon dictation along with smaller phrase technology. Any transcriptional errors that result from this process are unintentional.

## 2019-06-05 NOTE — Patient Instructions (Addendum)
Take meloxicam every morning with breakfast or snack   for 2 weeks   For muscle spasm take robaxin ( methocarbamol) in the evening  For 2 weeks   Hydrocodone is narcotic pain pill, take at bedtime if needed  REferral to dermatology

## 2019-06-23 DIAGNOSIS — L814 Other melanin hyperpigmentation: Secondary | ICD-10-CM | POA: Diagnosis not present

## 2019-06-23 DIAGNOSIS — D2239 Melanocytic nevi of other parts of face: Secondary | ICD-10-CM | POA: Diagnosis not present

## 2019-06-23 DIAGNOSIS — D225 Melanocytic nevi of trunk: Secondary | ICD-10-CM | POA: Diagnosis not present

## 2019-06-23 DIAGNOSIS — D485 Neoplasm of uncertain behavior of skin: Secondary | ICD-10-CM | POA: Diagnosis not present

## 2019-06-23 DIAGNOSIS — L820 Inflamed seborrheic keratosis: Secondary | ICD-10-CM | POA: Diagnosis not present

## 2019-06-28 ENCOUNTER — Other Ambulatory Visit: Payer: Self-pay | Admitting: Family Medicine

## 2019-11-20 ENCOUNTER — Ambulatory Visit (INDEPENDENT_AMBULATORY_CARE_PROVIDER_SITE_OTHER): Payer: BC Managed Care – PPO | Admitting: Family Medicine

## 2019-11-20 ENCOUNTER — Other Ambulatory Visit: Payer: Self-pay

## 2019-11-20 ENCOUNTER — Encounter: Payer: Self-pay | Admitting: Family Medicine

## 2019-11-20 VITALS — BP 120/62 | HR 86 | Temp 97.8°F | Resp 14 | Ht 66.0 in | Wt 223.0 lb

## 2019-11-20 DIAGNOSIS — Z1211 Encounter for screening for malignant neoplasm of colon: Secondary | ICD-10-CM

## 2019-11-20 DIAGNOSIS — E66812 Obesity, class 2: Secondary | ICD-10-CM

## 2019-11-20 DIAGNOSIS — Z23 Encounter for immunization: Secondary | ICD-10-CM

## 2019-11-20 DIAGNOSIS — E669 Obesity, unspecified: Secondary | ICD-10-CM | POA: Diagnosis not present

## 2019-11-20 DIAGNOSIS — F411 Generalized anxiety disorder: Secondary | ICD-10-CM

## 2019-11-20 DIAGNOSIS — N951 Menopausal and female climacteric states: Secondary | ICD-10-CM

## 2019-11-20 DIAGNOSIS — Z0000 Encounter for general adult medical examination without abnormal findings: Secondary | ICD-10-CM

## 2019-11-20 DIAGNOSIS — Z1231 Encounter for screening mammogram for malignant neoplasm of breast: Secondary | ICD-10-CM

## 2019-11-20 DIAGNOSIS — Z0001 Encounter for general adult medical examination with abnormal findings: Secondary | ICD-10-CM

## 2019-11-20 DIAGNOSIS — F5104 Psychophysiologic insomnia: Secondary | ICD-10-CM

## 2019-11-20 MED ORDER — ALPRAZOLAM 0.25 MG PO TABS: 0.2500 mg | ORAL_TABLET | Freq: Two times a day (BID) | ORAL | 1 refills | Status: DC | PRN

## 2019-11-20 MED ORDER — TRAZODONE HCL 100 MG PO TABS
ORAL_TABLET | ORAL | 3 refills | Status: DC
Start: 2019-11-20 — End: 2020-06-13

## 2019-11-20 NOTE — Patient Instructions (Addendum)
Perimenopausal- Estroven/Amberon  Schedule Mammogram  We will call with lab results  cologuard to be done Flu shot today  F/U pending results

## 2019-11-20 NOTE — Assessment & Plan Note (Signed)
Add prn xanax to trazodone Referral to therapy which I think will be beneficial She had years of stressors, court dates, divorce issues that have lead to her chronic anxiety /sleep issues

## 2019-11-20 NOTE — Progress Notes (Signed)
Subjective:    Patient ID: [name] Fernandez, female    DOB: Jan 12, 1971, 49 y.o.   MRN: [id number]  Patient presents for Annual Exam (is not fasting)  PT here for CPE  medications and history reviewed  Currently taking trazodone for sleep and anxiety/depression but still gets very anxious and has panic attacks, she thinks her hormones are contributing as well, since she gets hot flashes and mood swings, sweats the past few months She did try amberon for a few months it helped a little She now has sole custody of her daughter but that means she has no help at all which at times is overwhelming She now has insurance and is willing to proceed with therapy again  Other concern is her weight , she has not been able to keep any weight off, she tried KETO and lost 15lbs but then stalled out and she gave in and gained all her weight back    Due for mammogram  Due for Flu shot   Due for Colon cancer screening   PAP Smear UTD July 2020   LMP 2 months ago, previously to that it skipped 2 months  UTD with eye doctor and Dentist   MVI and Biotin vitamin   Review Of Systems:  GEN- denies fatigue, fever, weight loss,weakness, recent illness HEENT- denies eye drainage, change in vision, nasal discharge, CVS- denies chest pain, palpitations RESP- denies SOB, cough, wheeze ABD- denies N/V, change in stools, abd pain GU- denies dysuria, hematuria, dribbling, incontinence MSK- denies joint pain, muscle aches, injury Neuro- denies headache, dizziness, syncope, seizure activity       Objective:    BP 120/62   Pulse 86   Temp 97.8 F (36.6 C) (Temporal)   Resp 14   Ht 5\' 6"  (1.676 m)   Wt 223 lb (101.2 kg)   SpO2 97%   BMI 35.99 kg/m  GEN- NAD, alert and oriented x3 HEENT- PERRL, EOMI, non injected sclera, pink conjunctiva, MMM, oropharynx clear, TM clear no effusion  Neck- Supple, no thyromegaly CVS- RRR, no murmur RESP-CTAB ABD-NABS,soft,NT,ND Psych- tearful at times, otherwise  normal affect and mood, no SI, well groomed, good eye contact  EXT- No edema Pulses- Radial, DP- 2+        Assessment & Plan:      Problem List Items Addressed This Visit      Unprioritized   Chronic insomnia   Class 2 obesity    Discussed GLP-1 therapy with low carb (Not KETO) diet Check labs Would benefit from once weekly injection       Relevant Orders   Hemoglobin A1c   GAD (generalized anxiety disorder)    Add prn xanax to trazodone Referral to therapy which I think will be beneficial She had years of stressors, court dates, divorce issues that have lead to her chronic anxiety /sleep issues      Relevant Medications   ALPRAZolam (XANAX) 0.25 MG tablet   traZODone (DESYREL) 100 MG tablet    Other Visit Diagnoses    Routine general medical examination at a health care facility    -  Primary   CPE done, fasting labs obtained, pt to schedule mammo, cologuard to be done   Relevant Orders   TSH   CBC with Differential/Platelet   Comprehensive metabolic panel   Hemoglobin A1c   Lipid panel   Perimenopause       check hormones TSH, at this time recommend OTC meds Estroven/amberon if no other metabolic  cause    Relevant Orders   TSH   FSH/LH   Encounter for screening mammogram for malignant neoplasm of breast       Relevant Orders   MM 3D SCREEN BREAST BILATERAL   Need for immunization against influenza       Relevant Orders   Flu Vaccine QUAD 36+ mos IM (Completed)   Colon cancer screening       Cologuard to be ordered       Note: This dictation was prepared with Dragon dictation along with smaller phrase technology. Any transcriptional errors that result from this process are unintentional.

## 2019-11-20 NOTE — Assessment & Plan Note (Signed)
Discussed GLP-1 therapy with low carb (Not KETO) diet Check labs Would benefit from once weekly injection

## 2019-11-21 LAB — TSH: TSH: 2.75 mIU/L

## 2019-11-21 LAB — COMPREHENSIVE METABOLIC PANEL
AG Ratio: 1.3 (calc) (ref 1.0–2.5)
ALT: 27 U/L (ref 6–29)
AST: 21 U/L (ref 10–35)
Albumin: 4.3 g/dL (ref 3.6–5.1)
Alkaline phosphatase (APISO): 80 U/L (ref 37–153)
BUN: 14 mg/dL (ref 7–25)
CO2: 27 mmol/L (ref 20–32)
Calcium: 9.7 mg/dL (ref 8.6–10.4)
Chloride: 101 mmol/L (ref 98–110)
Creat: 0.63 mg/dL (ref 0.50–1.05)
Globulin: 3.3 g/dL (calc) (ref 1.9–3.7)
Glucose, Bld: 78 mg/dL (ref 65–99)
Potassium: 4.5 mmol/L (ref 3.5–5.3)
Sodium: 138 mmol/L (ref 135–146)
Total Bilirubin: 0.3 mg/dL (ref 0.2–1.2)
Total Protein: 7.6 g/dL (ref 6.1–8.1)

## 2019-11-21 LAB — CBC WITH DIFFERENTIAL/PLATELET
Absolute Monocytes: 561 cells/uL (ref 200–950)
Basophils Absolute: 151 cells/uL (ref 0–200)
Basophils Relative: 1.7 %
Eosinophils Absolute: 614 cells/uL — ABNORMAL HIGH (ref 15–500)
Eosinophils Relative: 6.9 %
HCT: 39.4 % (ref 35.0–45.0)
Hemoglobin: 11.9 g/dL (ref 11.7–15.5)
Lymphs Abs: 2065 cells/uL (ref 850–3900)
MCH: 23.1 pg — ABNORMAL LOW (ref 27.0–33.0)
MCHC: 30.2 g/dL — ABNORMAL LOW (ref 32.0–36.0)
MCV: 76.5 fL — ABNORMAL LOW (ref 80.0–100.0)
MPV: 9.8 fL (ref 7.5–12.5)
Monocytes Relative: 6.3 %
Neutro Abs: 5509 cells/uL (ref 1500–7800)
Neutrophils Relative %: 61.9 %
Platelets: 368 10*3/uL (ref 140–400)
RBC: 5.15 10*6/uL — ABNORMAL HIGH (ref 3.80–5.10)
RDW: 17.5 % — ABNORMAL HIGH (ref 11.0–15.0)
Total Lymphocyte: 23.2 %
WBC: 8.9 10*3/uL (ref 3.8–10.8)

## 2019-11-21 LAB — LIPID PANEL
Cholesterol: 205 mg/dL — ABNORMAL HIGH (ref <200)
HDL: 67 mg/dL (ref >=50)
LDL Cholesterol (Calc): 113 mg/dL (calc) — ABNORMAL HIGH
Non-HDL Cholesterol (Calc): 138 mg/dL (calc) — ABNORMAL HIGH (ref <130)
Total CHOL/HDL Ratio: 3.1 (calc) (ref <5.0)
Triglycerides: 137 mg/dL (ref <150)

## 2019-11-21 LAB — HEMOGLOBIN A1C
Hgb A1c MFr Bld: 5.5 % of total Hgb (ref <5.7)
Mean Plasma Glucose: 111 (calc)
eAG (mmol/L): 6.2 (calc)

## 2019-11-21 LAB — FSH/LH
FSH: 22.6 m[IU]/mL
LH: 28.4 m[IU]/mL

## 2019-11-21 MED ORDER — SAXENDA 18 MG/3ML ~~LOC~~ SOPN: 0.6000 mg | PEN_INJECTOR | Freq: Every day | SUBCUTANEOUS | 3 refills | Status: DC

## 2019-11-21 NOTE — Addendum Note (Signed)
Addended by: Vic Blackbird F on: 11/21/2019 08:03 AM   Modules accepted: Orders

## 2019-11-23 ENCOUNTER — Telehealth: Payer: Self-pay | Admitting: Family Medicine

## 2019-11-23 NOTE — Telephone Encounter (Signed)
Received a fax from cover my meds prior auth for saxenda 18 mg/3 ml pen injectors Key [id number]

## 2019-11-24 NOTE — Telephone Encounter (Signed)
Your information has been submitted to [employer] [place]. [employer] [place] will review the request and notify you of the determination decision directly, typically within 72 hours of receiving all information.  You will also receive your request decision electronically. To check for an update later, open this request again from your dashboard.  If [employer] [place] has not responded within the specified timeframe or if you have any questions about your PA submission, contact [employer] [place] directly at [phone].   

## 2019-11-24 NOTE — Telephone Encounter (Signed)
Received request from pharmacy for PA on Saxenda.   PA submitted.   Dx: E66.09- Obesity.  

## 2019-11-29 MED ORDER — SAXENDA 18 MG/3ML ~~LOC~~ SOPN: 0.6000 mg | PEN_INJECTOR | Freq: Every day | SUBCUTANEOUS | 3 refills | Status: DC

## 2019-11-29 MED ORDER — INSULIN PEN NEEDLE 32G X 4 MM MISC: 1 refills | Status: DC

## 2019-11-29 NOTE — Telephone Encounter (Signed)
PA approved 11/24/2019- 03/28/2020.  Pharmacy made aware.

## 2019-12-01 ENCOUNTER — Telehealth: Payer: Self-pay | Admitting: *Deleted

## 2019-12-01 NOTE — Telephone Encounter (Signed)
-----   Message from Alycia Rossetti, MD sent at 11/20/2019  5:18 PM EDT ----- Regarding: Send cologuard

## 2019-12-01 NOTE — Telephone Encounter (Signed)
Received verbal orders for Cologuard.   Order placed via [facility].   Cologuard (Order [id number])

## 2019-12-13 ENCOUNTER — Other Ambulatory Visit: Payer: Self-pay

## 2019-12-13 ENCOUNTER — Ambulatory Visit
Admission: RE | Admit: 2019-12-13 | Discharge: 2019-12-13 | Disposition: A | Discharge status: Home / Self Care | Payer: BC Managed Care – PPO | Source: Ambulatory Visit | Attending: Family Medicine | Admitting: Family Medicine

## 2019-12-13 DIAGNOSIS — Z1231 Encounter for screening mammogram for malignant neoplasm of breast: Secondary | ICD-10-CM

## 2019-12-21 DIAGNOSIS — S39012A Strain of muscle, fascia and tendon of lower back, initial encounter: Secondary | ICD-10-CM | POA: Diagnosis not present

## 2020-01-01 ENCOUNTER — Telehealth: Payer: Self-pay | Admitting: Family Medicine

## 2020-01-01 MED ORDER — PHENTERMINE HCL 37.5 MG PO TABS: 18.7500 mg | ORAL_TABLET | Freq: Every day | ORAL | 1 refills | Status: DC

## 2020-01-01 MED ORDER — NORETHINDRONE ACET-ETHINYL EST 1-20 MG-MCG PO TABS: 1.0000 | ORAL_TABLET | Freq: Every day | ORAL | 6 refills | Status: DC

## 2020-01-01 NOTE — Telephone Encounter (Signed)
[name] Fernandez was to expensive is there another medication lot cheaper can be fax over to the pharmacy  Also would like to see if Dr.Durhamm can prescribe medication for her hormones

## 2020-01-01 NOTE — Telephone Encounter (Signed)
Call pt, she is perimenopausal, she could go on low dose birth control, but this is not going to make her lose weight. I typically dont recommend hormones unless symptoms of hot flashes are too unbearable  For her weight, since Saxenda is so expensive, we can try a lose of phentermine , 1/2 tablet once a day  it is a stimulant and she has some underlying anxiety, so if she notices this is worse, then she would need to stop the medication but this is the cheapest option

## 2020-01-01 NOTE — Telephone Encounter (Signed)
Call placed to patient and patient made aware.   Agreeable to low dose Phentermine.   Also agreeable to low dose OBC. MD please advise.

## 2020-01-01 NOTE — Addendum Note (Signed)
Addended by: Sheral Flow on: 01/01/2020 05:04 PM   Modules accepted: Orders

## 2020-01-01 NOTE — Addendum Note (Signed)
Addended by: Vic Blackbird F on: 01/01/2020 05:20 PM   Modules accepted: Orders

## 2020-01-01 NOTE — Telephone Encounter (Signed)
Please advise 

## 2020-01-06 DIAGNOSIS — Z23 Encounter for immunization: Secondary | ICD-10-CM | POA: Diagnosis not present

## 2020-01-15 DIAGNOSIS — M5442 Lumbago with sciatica, left side: Secondary | ICD-10-CM | POA: Diagnosis not present

## 2020-01-15 DIAGNOSIS — G8929 Other chronic pain: Secondary | ICD-10-CM | POA: Diagnosis not present

## 2020-01-23 ENCOUNTER — Telehealth: Payer: Self-pay | Admitting: *Deleted

## 2020-01-23 NOTE — Telephone Encounter (Signed)
[place].. 01/23/2020.

## 2020-01-23 NOTE — Telephone Encounter (Signed)
Okay to increase to 1 full tablet of phentermine OV in 4 weeks if NOT SCHEDULED already

## 2020-01-23 NOTE — Telephone Encounter (Signed)
Received call from patient.   Reports that she is currently taking 1/2 tab of Phentermine and is not seeing any decrease in weight or appetite.   Inquired as to if she could increase to a whole tab.  States that she has experienced no side effects from medication.   MD please advise.

## 2020-01-25 MED ORDER — PHENTERMINE HCL 37.5 MG PO TABS
37.5000 mg | ORAL_TABLET | Freq: Every day | ORAL | 1 refills | Status: DC
Start: 2020-01-25 — End: 2020-02-27

## 2020-01-25 NOTE — Telephone Encounter (Signed)
Call placed to patient and patient made aware. Agreeable to plan.   Appointment scheduled.    Called refill to pharmacy.

## 2020-02-27 ENCOUNTER — Other Ambulatory Visit: Payer: Self-pay

## 2020-02-27 ENCOUNTER — Telehealth (INDEPENDENT_AMBULATORY_CARE_PROVIDER_SITE_OTHER): Payer: BC Managed Care – PPO | Admitting: Family Medicine

## 2020-02-27 VITALS — BP 146/94 | HR 97 | Temp 97.4°F | Resp 16 | Wt 220.0 lb

## 2020-02-27 DIAGNOSIS — F5104 Psychophysiologic insomnia: Secondary | ICD-10-CM

## 2020-02-27 DIAGNOSIS — R030 Elevated blood-pressure reading, without diagnosis of hypertension: Secondary | ICD-10-CM | POA: Diagnosis not present

## 2020-02-27 DIAGNOSIS — E669 Obesity, unspecified: Secondary | ICD-10-CM

## 2020-02-27 DIAGNOSIS — F411 Generalized anxiety disorder: Secondary | ICD-10-CM | POA: Diagnosis not present

## 2020-02-27 MED ORDER — PHENTERMINE HCL 37.5 MG PO TABS: 37.5000 mg | ORAL_TABLET | Freq: Every day | ORAL | 1 refills | Status: DC

## 2020-02-27 NOTE — Progress Notes (Addendum)
Subjective:    Patient ID: [name] Fernandez, female    DOB: 08/12/1970, 50 y.o.   MRN: [id number]  Patient presents for Follow-up (Weight loss medication)  Patient here to follow-up phentermine.  She has been on 1 full tablet phentermine for the past 4 weeks.  She admits that she has not been following a good nutrition program she has had multiple holidays including her birthday.  She has been eating too many carbs and breaded foods.  She has not been drinking enough water.  She has chronic insomnia and this continues to be an issue for her. She does admit she is often on her phone or has TV on.   On average she only sleeps about 4 to 5 hours a night.  She feels better when she exercises but she has not been consistent with any exercise.  She did buy a stationary bike which she think she will enjoy riding but has not started using this either.  She understands that she has not been consistent with thing which is why she has not lost any weight but wants to start when some smaller goals today to see if she can get herself back on track.   Review Of Systems:  GEN- denies fatigue, fever, weight loss,weakness, recent illness HEENT- denies eye drainage, change in vision, nasal discharge, CVS- denies chest pain, palpitations RESP- denies SOB, cough, wheeze ABD- denies N/V, change in stools, abd pain GU- denies dysuria, hematuria, dribbling, incontinence MSK- denies joint pain, muscle aches, injury Neuro- denies headache, dizziness, syncope, seizure activity       Objective:    BP (!) 146/94   Pulse 97   Temp (!) 97.4 F (36.3 C)   Resp 16   Wt 220 lb (99.8 kg)   SpO2 97%   BMI 35.51 kg/m  GEN- NAD, alert and oriented x3 HEENT- PERRL, EOMI, non injected sclera, pink conjunctiva, MMM, oropharynx clear Neck- Supple, no thyromegaly CVS- RRR, no murmur RESP-CTAB Psych normal affect and mood  EXT- No edema Pulses- Radial, DP- 2+        Assessment & Plan:      Problem List Items  Addressed This Visit      Unprioritized   Chronic insomnia    Discussed how her chronic insomnia is also contributing to her weight gain/obesity.  Recommend that she try neuropathy in her she can use something like white noise or meditation app before bed.  Continue the trazodone.  I do think adding an exercise which she enjoys should also help improve her sleep.      Class 2 obesity - Primary    Down 3 pounds and she has not been following proper nutrition that she has not gained while being on the phentermine through the holidays.  Recommended she continue with phentermine especially his cough is an issue for her with weight loss meds.  We will follow back up in 1 to 2 months for recheck.  We discussed lower carb diet.  We will plan to start with pancakes with each meal and protein.  Increase water for the sugary beverages.  Addendum- CHART above should state   Down 3 pounds and she has not been following proper nutrition that she has not gained while being on the phentermine through the holidays.  Recommended she continue with phentermine especially since COST is an issue for her with weight loss meds.  We will follow back up in 1 to 2 months for recheck.  We discussed  lower carb diet.  We will plan to start with VEGGIES  with each meal and protein.  Increase water IN PLACE OF  sugary beverages.      GAD (generalized anxiety disorder)    Many stressors that lead to emotional eating Continue prn xanax Add exercise back into routine which she feels good with  Discussed sleep       Other Visit Diagnoses    Elevated blood pressure reading       treat with dietary changes. recheck next visit, still elevated > 140/90 add BP med       Note: This dictation was prepared with Dragon dictation along with smaller phrase technology. Any transcriptional errors that result from this process are unintentional.

## 2020-02-27 NOTE — Patient Instructions (Signed)
F/U 1 months

## 2020-02-28 ENCOUNTER — Encounter: Payer: Self-pay | Admitting: Family Medicine

## 2020-02-28 NOTE — Assessment & Plan Note (Signed)
Discussed how her chronic insomnia is also contributing to her weight gain/obesity.  Recommend that she try neuropathy in her she can use something like white noise or meditation app before bed.  Continue the trazodone.  I do think adding an exercise which she enjoys should also help improve her sleep.

## 2020-02-28 NOTE — Assessment & Plan Note (Signed)
Many stressors that lead to emotional eating Continue prn xanax Add exercise back into routine which she feels good with  Discussed sleep

## 2020-02-28 NOTE — Assessment & Plan Note (Addendum)
Down 3 pounds and she has not been following proper nutrition that she has not gained while being on the phentermine through the holidays.  Recommended she continue with phentermine especially his cough is an issue for her with weight loss meds.  We will follow back up in 1 to 2 months for recheck.  We discussed lower carb diet.  We will plan to start with pancakes with each meal and protein.  Increase water for the sugary beverages.  Addendum- CHART above should state   Down 3 pounds and she has not been following proper nutrition that she has not gained while being on the phentermine through the holidays.  Recommended she continue with phentermine especially since COST is an issue for her with weight loss meds.  We will follow back up in 1 to 2 months for recheck.  We discussed lower carb diet.  We will plan to start with VEGGIES  with each meal and protein.  Increase water IN PLACE OF  sugary beverages.

## 2020-03-12 ENCOUNTER — Telehealth: Payer: Self-pay | Admitting: Family Medicine

## 2020-03-12 NOTE — Telephone Encounter (Signed)
Received fax stating a PA was required on her saxenda Key: [id number]

## 2020-03-14 NOTE — Telephone Encounter (Signed)
Patient is not taking Saxenda at this time due to cost.   Medication was changed to Phentermine.

## 2020-03-29 DIAGNOSIS — Z3041 Encounter for surveillance of contraceptive pills: Secondary | ICD-10-CM | POA: Diagnosis not present

## 2020-03-29 DIAGNOSIS — Z0000 Encounter for general adult medical examination without abnormal findings: Secondary | ICD-10-CM | POA: Diagnosis not present

## 2020-04-15 ENCOUNTER — Encounter: Payer: Self-pay | Admitting: Family Medicine

## 2020-04-15 ENCOUNTER — Other Ambulatory Visit: Payer: Self-pay

## 2020-04-15 ENCOUNTER — Ambulatory Visit (INDEPENDENT_AMBULATORY_CARE_PROVIDER_SITE_OTHER): Payer: BC Managed Care – PPO | Admitting: Family Medicine

## 2020-04-15 VITALS — BP 138/86 | HR 98 | Temp 98.1°F | Resp 16 | Ht 66.0 in | Wt 216.0 lb

## 2020-04-15 DIAGNOSIS — F5104 Psychophysiologic insomnia: Secondary | ICD-10-CM

## 2020-04-15 DIAGNOSIS — E669 Obesity, unspecified: Secondary | ICD-10-CM

## 2020-04-15 MED ORDER — NORETHINDRONE ACET-ETHINYL EST 1-20 MG-MCG PO TABS: 1.0000 | ORAL_TABLET | Freq: Every day | ORAL | 6 refills | Status: DC

## 2020-04-15 MED ORDER — PHENTERMINE HCL 37.5 MG PO TABS: 37.5000 mg | ORAL_TABLET | Freq: Every day | ORAL | 1 refills | Status: DC

## 2020-04-15 MED ORDER — ALPRAZOLAM 0.25 MG PO TABS: 0.2500 mg | ORAL_TABLET | Freq: Two times a day (BID) | ORAL | 1 refills | Status: DC | PRN

## 2020-04-15 NOTE — Patient Instructions (Signed)
Change Reagan Segar to [name] Fernandez 07/17/2015 F/U 2 months [name] Fernandez

## 2020-04-15 NOTE — Progress Notes (Signed)
   Subjective:    Patient ID: [name] Fernandez, female    DOB: 02/24/1970, 50 y.o.   MRN: [id number]  Patient presents for Follow-up (Is fasting/) Here to follow-up weight loss medication.  She has class II obesity.  Associated with generalized anxiety chronic insomnia.  Sleep continues to be an issue she can fall asleep with her trazodone but often wakes up around 2 or 3:00 no matter what time she goes to sleep.  She uses her alprazolam as needed.  She typically exercise 2-3 times a week unfortunately unable to increase this at this time as she is in the peak of her work season.  She has been tracking the stairs however will walk up 4 flights of stairs when she can.  Her weight is down 7 pounds since her visit in October.  She is tolerating phentermine without any difficulties.  She is also been eating veggies and protein with most meals.  She is trying to snack on veggies and healthier options when she does eat something in the evening while working.  She drinks 2 cups of coffee in the morning but has switched to sugar-free creamer and sweet low.  She will drink 1-2 sodas per week.    Review Of Systems:  GEN- denies fatigue, fever, weight loss,weakness, recent illness HEENT- denies eye drainage, change in vision, nasal discharge, CVS- denies chest pain, palpitations RESP- denies SOB, cough, wheeze ABD- denies N/V, change in stools, abd pain GU- denies dysuria, hematuria, dribbling, incontinence MSK- denies joint pain, muscle aches, injury Neuro- denies headache, dizziness, syncope, seizure activity       Objective:    BP 138/86   Pulse 98   Temp 98.1 F (36.7 C) (Temporal)   Resp 16   Ht 5\' 6"  (1.676 m)   Wt 216 lb (98 kg)   SpO2 97%   BMI 34.86 kg/m  GEN- NAD, alert and oriented x3,obese CVS- RRR, no murmur RESP-CTAB Psych normal affect and mood EXT- No edema Pulses- Radial2+        Assessment & Plan:      Problem List Items Addressed This Visit       Unprioritized   Chronic insomnia - Primary    Continue trazodone, discussed not drinking fluids before bed, often has to get up to urinate She is reducing work time at night but in peak tax 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 Any transcriptional errors that result from this process are unintentional.

## 2020-04-16 ENCOUNTER — Telehealth: Payer: Self-pay | Admitting: *Deleted

## 2020-04-16 ENCOUNTER — Encounter: Payer: Self-pay | Admitting: Family Medicine

## 2020-04-16 NOTE — Assessment & Plan Note (Signed)
Continue trazodone, discussed not drinking fluids before bed, often has to get up to urinate She is reducing work time at night but in peak tax season for her job

## 2020-04-16 NOTE — Telephone Encounter (Signed)
Received request from pharmacy for PA on Phentermine.   PA submitted.   Dx: E66.09- obesity.   Your information has been submitted to [place]. Blue Cross [place] will review the request and notify you of the determination decision directly, typically within 72 hours of receiving all information.  You will also receive your request decision electronically. To check for an update later, open this request again from your dashboard.  If [employer] [place] has not responded within the specified timeframe or if you have any questions about your PA submission, contact [facility] directly at [phone].

## 2020-04-16 NOTE — Assessment & Plan Note (Addendum)
Doing well on phentermine No SE with medication Will continue with phentermine as benefit seen  For exercise keep at 2-3days a week and stairs at work Increase water to 4 cups a day to start Change in snacks if working late

## 2020-04-19 MED ORDER — PHENTERMINE HCL 37.5 MG PO TABS: 37.5000 mg | ORAL_TABLET | Freq: Every day | ORAL | 1 refills | Status: DC

## 2020-04-19 NOTE — Telephone Encounter (Signed)
Received PA determination.   PA denied.   PCP made aware and advised that patient can obtain discount with [facility].   Please send to [facility] per patient request.

## 2020-04-19 NOTE — Telephone Encounter (Signed)
Medication sent.

## 2020-06-13 ENCOUNTER — Ambulatory Visit (INDEPENDENT_AMBULATORY_CARE_PROVIDER_SITE_OTHER): Payer: BC Managed Care – PPO | Admitting: Nurse Practitioner

## 2020-06-13 ENCOUNTER — Other Ambulatory Visit: Payer: Self-pay

## 2020-06-13 ENCOUNTER — Encounter: Payer: Self-pay | Admitting: Nurse Practitioner

## 2020-06-13 VITALS — BP 140/80 | HR 102 | Temp 97.2°F | Wt 218.2 lb

## 2020-06-13 DIAGNOSIS — F411 Generalized anxiety disorder: Secondary | ICD-10-CM | POA: Diagnosis not present

## 2020-06-13 DIAGNOSIS — F5104 Psychophysiologic insomnia: Secondary | ICD-10-CM | POA: Diagnosis not present

## 2020-06-13 DIAGNOSIS — Z6835 Body mass index (BMI) 35.0-35.9, adult: Secondary | ICD-10-CM | POA: Diagnosis not present

## 2020-06-13 DIAGNOSIS — M25551 Pain in right hip: Secondary | ICD-10-CM

## 2020-06-13 MED ORDER — ALPRAZOLAM 0.25 MG PO TABS
0.2500 mg | ORAL_TABLET | Freq: Every day | ORAL | 0 refills | Status: DC | PRN
Start: 2020-06-13 — End: 2020-09-05

## 2020-06-13 MED ORDER — SEMAGLUTIDE(0.25 OR 0.5MG/DOS) 2 MG/1.5ML ~~LOC~~ SOPN: PEN_INJECTOR | SUBCUTANEOUS | 0 refills | Status: AC

## 2020-06-13 MED ORDER — TRAZODONE HCL 150 MG PO TABS: 150.0000 mg | ORAL_TABLET | Freq: Every evening | ORAL | 1 refills | Status: DC | PRN

## 2020-06-13 NOTE — Patient Instructions (Signed)

## 2020-06-13 NOTE — Progress Notes (Signed)
Subjective:    Patient ID: [name] Fernandez, female    DOB: 1971/01/24, 50 y.o.   MRN: [id number]  HPI: [name] Fernandez is a 50 y.o. female presenting for follow-up.  Chief Complaint  Patient presents with  . Weight Loss  . Insomnia    Wants to talk about adjustment, still not sleeping   OBESITY Patient reports she has been on phentermine since September/October 2021- slow progress.  She reports previously, when she is on phentermine, the weight would "just fall off " Duration: on and off for years.     Previous attempts at weight loss: yes Peak weight: 225 lbs Weight loss goal: ~135 Weight loss to date: ~8 lbs Requesting obesity pharmacotherapy: yes Current weight loss supplements/medications: yes; currently on Phentermine Previous weight loss supplements/meds: yes Physical activity: reports she does not have time; tries exercise bike-goal for her is twice weekly Water: does not drink much, maybe 1 bottle per day  INSOMNIA Does not have a problem getting to sleep.  Usually wakes up at least once per night to urinate, when she does this she is unable to fall back asleep.  Normally goes to sleep between 930 and 10 PM, does not have a bedtime ritual. Duration: years Satisfied with sleep quality: no Difficulty falling asleep: no Difficulty staying asleep: yes Waking a few hours after sleep onset: yes Early morning awakenings: yes Daytime hypersomnolence: no Wakes feeling refreshed: no Good sleep hygiene: no Apnea: no Snoring: yes Depressed/anxious mood: yes Recent stress: yes; tax season and recent "nasty" divorce Restless legs/nocturnal leg cramps: no Chronic pain/arthritis: no History of sleep study: yes Treatments attempted: Trazodone  HIP PAIN Worse first thing in the morning; sleeps more on left side  Duration: months Involved hip: right  Mechanism of injury: unknown Location: diffuse Onset: sudden  Severity: severe  Quality: shooting pain Frequency: every  day multiple times per day Radiation: yes; up back to mid back; down leg above knee Aggravating factors: going from sitting/laying to standing, bending over tub   Alleviating factors: heat  Status: stable Treatments attempted: ibuprofen, heat Relief with NSAIDs?: no Weakness with weight bearing: no Weakness with walking: no Paresthesias / decreased sensation: no  Morning stiffness: no Swelling: no Redness:no Fevers: no  ANXIETY/STRESS Patient reports increased anxiety since divorce from husband.  Takes alprazolam very sparingly for panic attacks.  Maybe about twice per week.  She is not interested in a daily medication at this time, and has considered counseling in the past but has not worked out well. Duration: Chronic Anxious mood: yes  Excessive worrying: yes Irritability: no  Sweating: yes Nausea: no Palpitations:yes Hyperventilation: yes Panic attacks: yes Agoraphobia: no  Obscessions/compulsions: no Depressed mood: non  Anhedonia: no Weight changes: yes Insomnia: yes hard to stay asleep  Hypersomnia: no Fatigue/loss of energy: yes Feelings of worthlessness: no Feelings of guilt: no Impaired concentration/indecisiveness: yes Suicidal ideations: no  Crying spells: yes Recent Stressors/Life Changes: yes   Relationship problems: yes   Family stress: yes     Financial stress: no    Job stress: yes    Recent death/loss: no  Allergies  Allergen Reactions  . Sulfa Antibiotics     Nausea     Outpatient Encounter Medications as of 06/13/2020  Medication Sig  . Multiple Vitamin (MULTIVITAMIN WITH MINERALS) TABS tablet Take 1 tablet by mouth daily. Women's one a day  . Multiple Vitamins-Minerals (HAIR SKIN & NAILS ADVANCED PO) Take by mouth.  . norethindrone-ethinyl estradiol (LOESTRIN  1/20, 21,) 1-20 MG-MCG tablet Take 1 tablet by mouth daily.  . phentermine (ADIPEX-P) 37.5 MG tablet Take 1 tablet (37.5 mg total) by mouth daily before breakfast.  .  Semaglutide,0.25 or 0.5MG /DOS, 2 MG/1.5ML SOPN Inject 0.25 mg into the skin once a week for 28 days, THEN 0.5 mg once a week for 28 days.  . [DISCONTINUED] ALPRAZolam (XANAX) 0.25 MG tablet Take 1 tablet (0.25 mg total) by mouth 2 (two) times daily as needed for anxiety.  . [DISCONTINUED] traZODone (DESYREL) 100 MG tablet TAKE 1 TABLET BY MOUTH EVERY DAY AT BEDTIME AS NEEDED FOR SLEEP  . ALPRAZolam (XANAX) 0.25 MG tablet Take 1 tablet (0.25 mg total) by mouth daily as needed for anxiety.  . traZODone (DESYREL) 150 MG tablet Take 1 tablet (150 mg total) by mouth at bedtime as needed for sleep.   No facility-administered encounter medications on file as of 06/13/2020.    Patient Active Problem List   Diagnosis Date Noted  . Hip pain, right 06/15/2020  . GAD (generalized anxiety disorder) 11/20/2019  . Class 2 obesity 09/09/2018  . Stress at home 04/11/2017  . Chronic insomnia 11/15/2015  . Renal disorder     Past Medical History:  Diagnosis Date  . Renal disorder    kidney stones    Relevant past medical, surgical, family and social history reviewed and updated as indicated. Interim medical history since our last visit reviewed.  Review of Systems Per HPI unless specifically indicated above     Objective:    BP 140/80   Pulse (!) 102   Temp (!) 97.2 F (36.2 C)   Wt 218 lb 3.2 oz (99 kg)   SpO2 98%   BMI 35.22 kg/m   Wt Readings from Last 3 Encounters:  06/13/20 218 lb 3.2 oz (99 kg)  04/15/20 216 lb (98 kg)  02/27/20 220 lb (99.8 kg)    Physical Exam Vitals and nursing note reviewed.  Constitutional:      General: She is not in acute distress.    Appearance: Normal appearance. She is obese. She is not toxic-appearing.  HENT:     Head: Normocephalic and atraumatic.  Eyes:     General: No scleral icterus.    Extraocular Movements: Extraocular movements intact.  Cardiovascular:     Rate and Rhythm: Normal rate.     Heart sounds: Normal heart sounds.  Pulmonary:      Effort: Pulmonary effort is normal. No respiratory distress.     Breath sounds: No wheezing, rhonchi or rales.  Abdominal:     General: Abdomen is flat. Bowel sounds are normal.  Skin:    General: Skin is warm.     Coloration: Skin is not jaundiced or pale.     Findings: No erythema.  Neurological:     Mental Status: She is alert and oriented to person, place, and time.     Motor: No weakness.     Gait: Gait normal.  Psychiatric:        Mood and Affect: Mood normal.        Behavior: Behavior normal.        Thought Content: Thought content normal.        Judgment: Judgment normal.       Assessment & Plan:   Problem List Items Addressed This Visit      Other   Hip pain, right    Acute.  History and examination consistent with musculoskeletal pain.  No red flags in history  or on examination.  We will start with exercises at home- handout given.  Also encouraged increasing physical activity with stretching included and increasing water intake.  Follow-up if not improving, consider physical therapy.      GAD (generalized anxiety disorder)    Chronic.  Reports using alprazolam seldom- maybe 2 times per week.  PDMP reviewed and refill of alprazolam given -to last at least 3 months.  Discussed that if patient notices she is reaching for this medication more frequently, notify us so we can discuss starting a daily medication.  Desires referral for counseling-we will [facility] in [place].  Continue with physical activity and increase water intake.  Goal of physical activity is 30 minutes 5 times weekly.  Follow-up in 3 months.      Relevant Medications   ALPRAZolam (XANAX) 0.25 MG tablet   traZODone (DESYREL) 150 MG tablet   Other Relevant Orders   Ambulatory referral to Psychology   Class 2 obesity - Primary    Chronic.  Without much success from phentermine since beginning about 6 months ago, will taper off and start semaglutide.  No  history of thyroid cancer.  Discussed side effects of semaglutide and how this medication works-slows down digestion, so we will likely need to eat smaller meals more frequently.  Encourage patient to increase physical activity-at least 30 minutes 5 times weekly.  Also encouraged to increase water intake.  Follow-up in 2 months.      Relevant Medications   Semaglutide,0.25 or 0.5MG /DOS, 2 MG/1.5ML SOPN   Chronic insomnia    Chronic.  Will increase trazodone to 150 mg nightly.  Discussed limiting caffeine in the afternoon to avoid bladder stimulation and increased water intake.  Referral placed to counseling today to help with anxiety-may also help with sleep at night.  Follow-up in 2 months.      Relevant Medications   traZODone (DESYREL) 150 MG tablet   Other Relevant Orders   Ambulatory referral to Sleep Studies       Follow up plan: Return in about 2 months (around 08/13/2020) for weight loss, insomnia.

## 2020-06-15 DIAGNOSIS — M25551 Pain in right hip: Secondary | ICD-10-CM | POA: Insufficient documentation

## 2020-06-15 NOTE — Assessment & Plan Note (Addendum)
Chronic.  Without much success from phentermine since beginning about 6 months ago, will taper off and start semaglutide.  No history of thyroid cancer.  Discussed side effects of semaglutide and how this medication works-slows down digestion, so we will likely need to eat smaller meals more frequently.  Encourage patient to increase physical activity-at least 30 minutes 5 times weekly.  Also encouraged to increase water intake.  Follow-up in 2 months.

## 2020-06-15 NOTE — Assessment & Plan Note (Signed)
Chronic.  Will increase trazodone to 150 mg nightly.  Discussed limiting caffeine in the afternoon to avoid bladder stimulation and increased water intake.  Referral placed to counseling today to help with anxiety-may also help with sleep at night.  Follow-up in 2 months.

## 2020-06-15 NOTE — Assessment & Plan Note (Signed)
Chronic.  Reports using alprazolam seldom- maybe 2 times per week.  PDMP reviewed and refill of alprazolam given -to last at least 3 months.  Discussed that if patient notices she is reaching for this medication more frequently, notify us so we can discuss starting a daily medication.  Desires referral for counseling-we will place [facility] in [place].  Continue with physical activity and increase water intake.  Goal of physical activity is 30 minutes 5 times weekly.  Follow-up in 3 months.

## 2020-06-15 NOTE — Assessment & Plan Note (Signed)
Acute.  History and examination consistent with musculoskeletal pain.  No red flags in history or on examination.  We will start with exercises at home- handout given.  Also encouraged increasing physical activity with stretching included and increasing water intake.  Follow-up if not improving, consider physical therapy.

## 2020-06-21 ENCOUNTER — Telehealth: Payer: Self-pay | Admitting: *Deleted

## 2020-06-21 NOTE — Telephone Encounter (Signed)
Received request from pharmacy for PA on Ozempic  PA submitted.   Dx: O83- metabolic syndrome  Your information has been submitted to [place]. [employer] [place] will review the request and notify you of the determination decision directly, typically within 72 hours of receiving all information.  You will also receive your request decision electronically. To check for an update later, open this request again from your dashboard.  If [employer] [place] has not responded within the specified timeframe or if you have any questions about your PA submission, contact [facility] [place] directly at [phone].

## 2020-06-27 ENCOUNTER — Telehealth: Payer: Self-pay

## 2020-06-27 NOTE — Telephone Encounter (Signed)
Spoke with pt, says she will call the insurance company to see what medication she can take for weightloss

## 2020-07-10 ENCOUNTER — Emergency Department (HOSPITAL_COMMUNITY)
Admission: EM | Admit: 2020-07-10 | Discharge: 2020-07-10 | Disposition: A | Discharge status: Home / Self Care | Payer: BC Managed Care – PPO | Attending: Emergency Medicine | Admitting: Emergency Medicine

## 2020-07-10 ENCOUNTER — Encounter (HOSPITAL_COMMUNITY): Payer: Self-pay

## 2020-07-10 ENCOUNTER — Other Ambulatory Visit: Payer: Self-pay

## 2020-07-10 DIAGNOSIS — L259 Unspecified contact dermatitis, unspecified cause: Secondary | ICD-10-CM | POA: Diagnosis not present

## 2020-07-10 DIAGNOSIS — L237 Allergic contact dermatitis due to plants, except food: Secondary | ICD-10-CM | POA: Diagnosis not present

## 2020-07-10 DIAGNOSIS — Z87891 Personal history of nicotine dependence: Secondary | ICD-10-CM | POA: Insufficient documentation

## 2020-07-10 DIAGNOSIS — R21 Rash and other nonspecific skin eruption: Secondary | ICD-10-CM | POA: Diagnosis not present

## 2020-07-10 MED ORDER — DIPHENHYDRAMINE HCL 25 MG PO CAPS
50.0000 mg | ORAL_CAPSULE | Freq: Once | ORAL | Status: AC
  Administered 2020-07-10: 50 mg via ORAL
  Filled 2020-07-10: qty 2

## 2020-07-10 MED ORDER — PREDNISONE 10 MG PO TABS: ORAL_TABLET | ORAL | 0 refills | Status: DC

## 2020-07-10 MED ORDER — PREDNISONE 20 MG PO TABS
60.0000 mg | ORAL_TABLET | Freq: Once | ORAL | Status: AC
  Administered 2020-07-10: 60 mg via ORAL
  Filled 2020-07-10: qty 3

## 2020-07-10 NOTE — ED Provider Notes (Signed)
[facility] DEPT Provider Note   CSN: [id number] Arrival date & time: 07/10/20  2138     History No chief complaint on file.   [name] Fernandez is a 50 y.o. female.  Patient to ED with itching rash that started 5 days ago after working in her garden and coming into contact with poison ivy. She reports having similar reaction in the past. She states the first area of rash started on her left wrist and new areas continue to appear to bilateral LE's, posterior right shoulder, bilateral UE's. She states she has generalized itching, including to the face but without apparent raised rash. She has been using benadryl, calamine lotion without relief.   The history is provided by the patient. No language interpreter was used.       Past Medical History:  Diagnosis Date  . Renal disorder    kidney stones    Patient Active Problem List   Diagnosis Date Noted  . Hip pain, right 06/15/2020  . GAD (generalized anxiety disorder) 11/20/2019  . Class 2 obesity 09/09/2018  . Stress at home 04/11/2017  . Chronic insomnia 11/15/2015  . Renal disorder     Past Surgical History:  Procedure Laterality Date  . CESAREAN SECTION  2015  . FRACTURE SURGERY    . OPEN REDUCTION INTERNAL FIXATION (ORIF) METACARPAL Right 02/07/2015   Procedure: OPEN REDUCTION INTERNAL FIXATION (ORIF) RIGHT INDEX METACARPAL;  Surgeon: Leanora Cover, MD;  Location: [facility];  Service: Orthopedics;  Laterality: Right;  . TUBAL LIGATION    . UTERINE STENT PLACEMENT       OB History   No obstetric history on file.     Family History  Problem Relation Age of Onset  . Arthritis Father        rheumatoid  . COPD Father   . Cancer Sister        UTERINE CANCER     Social History   Tobacco Use  . Smoking status: Former Smoker    Packs/day: 0.25    Types: Cigarettes  . Smokeless tobacco: Never Used  Substance Use Topics  . Alcohol use: No  . Drug use: No    Home  Medications Prior to Admission medications   Medication Sig Start Date End Date Taking? Authorizing Provider  ALPRAZolam (XANAX) 0.25 MG tablet Take 1 tablet (0.25 mg total) by mouth daily as needed for anxiety. 06/13/20   Eulogio Bear, NP  Multiple Vitamin (MULTIVITAMIN WITH MINERALS) TABS tablet Take 1 tablet by mouth daily. Women's one a day    [provider]  Multiple Vitamins-Minerals (HAIR SKIN & NAILS ADVANCED PO) Take by mouth.    [provider]  norethindrone-ethinyl estradiol (LOESTRIN 1/20, 21,) 1-20 MG-MCG tablet Take 1 tablet by mouth daily. 04/15/20   Alycia Rossetti, MD  phentermine (ADIPEX-P) 37.5 MG tablet Take 1 tablet (37.5 mg total) by mouth daily before breakfast. 04/19/20   [place], Modena Nunnery, MD  Semaglutide,0.25 or 0.5MG /DOS, 2 MG/1.5ML SOPN Inject 0.25 mg into the skin once a week for 28 days, THEN 0.5 mg once a week for 28 days. 06/13/20 08/08/20  Eulogio Bear, NP  traZODone (DESYREL) 150 MG tablet Take 1 tablet (150 mg total) by mouth at bedtime as needed for sleep. 06/13/20   Eulogio Bear, NP    Allergies    Sulfa antibiotics  Review of Systems   Review of Systems  Skin:       Rash  and itching    Physical Exam Updated Vital Signs BP (!) 151/94 (BP Location: Left Arm)   Pulse 90   Temp 98.7 F (37.1 C)   Resp 18   SpO2 100%   Physical Exam Constitutional:      Appearance: She is well-developed.  Pulmonary:     Effort: Pulmonary effort is normal.  Musculoskeletal:     Cervical back: Normal range of motion.  Skin:    General: Skin is warm and dry.     Comments: Raised, fluid filled, erythematous rash to dorsal left wrist that exhibit a linear pattern. There are few singular simialr area of rash to bilateral arms. No facial rash visualized.   Neurological:     Mental Status: She is alert and oriented to person, place, and time.     ED Results / Procedures / Treatments   Labs (all labs ordered are listed, but  only abnormal results are displayed) Labs Reviewed - No data to display  EKG None  Radiology No results found.  Procedures Procedures   Medications Ordered in ED Medications  predniSONE (DELTASONE) tablet 60 mg (60 mg Oral Given 07/10/20 2315)  diphenhydrAMINE (BENADRYL) capsule 50 mg (50 mg Oral Given 07/10/20 2315)    ED Course  I have reviewed the triage vital signs and the nursing notes.  Pertinent labs & imaging results that were available during my care of the patient were reviewed by me and considered in my medical decision making (see chart for details).    MDM Rules/Calculators/A&P                          Patient to ED with itching rash after working in her yard 5 days ago. C/w previous poison ivy reactions. She has diffuse, infrequent areas of characteristic rash generally.   Will start on 12-day taper prednisone. Encouraged continuation of benadryl, calamine or other supportive measures.    Final Clinical Impression(s) / ED Diagnoses Final diagnoses:  None   1. Contact dermatitis  Rx / DC Orders ED Discharge Orders    None       Dennie Bible 07/10/20 2326    Milton Ferguson, MD 07/16/20 1227

## 2020-07-10 NOTE — ED Notes (Signed)
Pt verbalized understanding of d/c, medication, and follow up care. Ambulatory with steady gait.  

## 2020-07-10 NOTE — ED Triage Notes (Signed)
Pt reports poison ivy that began on Friday and is now in various places on her body. Pt reports using multiple OTC treatments without relief.

## 2020-07-10 NOTE — Discharge Instructions (Signed)
Continue use of benadryl to aid itching.   Follow up with your doctor as needed.

## 2020-08-01 DIAGNOSIS — H5213 Myopia, bilateral: Secondary | ICD-10-CM | POA: Diagnosis not present

## 2020-08-01 DIAGNOSIS — R030 Elevated blood-pressure reading, without diagnosis of hypertension: Secondary | ICD-10-CM | POA: Diagnosis not present

## 2020-08-01 DIAGNOSIS — H40053 Ocular hypertension, bilateral: Secondary | ICD-10-CM | POA: Diagnosis not present

## 2020-08-16 ENCOUNTER — Ambulatory Visit: Payer: BC Managed Care – PPO | Admitting: Nurse Practitioner

## 2020-08-26 ENCOUNTER — Institutional Professional Consult (permissible substitution): Payer: BC Managed Care – PPO | Admitting: Neurology

## 2020-09-03 ENCOUNTER — Other Ambulatory Visit: Payer: Self-pay | Admitting: Nurse Practitioner

## 2020-09-03 DIAGNOSIS — F411 Generalized anxiety disorder: Secondary | ICD-10-CM

## 2020-09-04 NOTE — Telephone Encounter (Signed)
Ok to refill??  Last office visit 06/13/2020.  Last refill 06/13/2020.

## 2020-09-13 ENCOUNTER — Telehealth: Payer: Self-pay

## 2020-09-13 NOTE — Telephone Encounter (Signed)
Pt is scheduled via mychart video to discuss weight loss medication options

## 2020-09-18 NOTE — Progress Notes (Signed)
Subjective:    Patient ID: [name] Fernandez, female    DOB: 1970-08-20, 50 y.o.   MRN: [id number]  HPI: [name] Fernandez is a 50 y.o. female presenting for  Chief Complaint  Patient presents with   Weight Loss    Discuss weight loss medication, pt taken off of phentermine   ELEVATED BMI: 35 Duration: chronic Previous attempts at weight loss: yes Peak weight: 225 Weight loss goal: ~135  Weight loss to date: unknown today Requesting obesity pharmacotherapy: yes Current weight loss supplements/medications: no Previous weight loss supplements/meds: yes; Phentermine  Water intake: drinks mostly water throughout the day 24 hour diet recall:avoids fast food, fried foods.  Tries to eat vegetable as side with both lunch and dinner  Physical activity: tries 45 minutes on bike x 3 evenings per week  Allergies  Allergen Reactions   Sulfa Antibiotics     Nausea     Outpatient Encounter Medications as of 09/19/2020  Medication Sig   ALPRAZolam (XANAX) 0.25 MG tablet TAKE 1 TABLET BY MOUTH DAILY AS NEEDED FOR ANXIETY   Multiple Vitamin (MULTIVITAMIN WITH MINERALS) TABS tablet Take 1 tablet by mouth daily. Women's one a day   Multiple Vitamins-Minerals (HAIR SKIN & NAILS ADVANCED PO) Take by mouth.   Naltrexone-buPROPion HCl ER 8-90 MG TB12 Take 1 tablet by mouth daily with breakfast for 7 days. Start 1 tablet every morning for 7 days, then 1 tablet twice daily for 7 days, then 2 tablets every morning and one in the evening for 7 days and continue at that dose until follow up.   norethindrone-ethinyl estradiol (LOESTRIN 1/20, 21,) 1-20 MG-MCG tablet Take 1 tablet by mouth daily.   traZODone (DESYREL) 150 MG tablet Take 1 tablet (150 mg total) by mouth at bedtime as needed for sleep.   [DISCONTINUED] phentermine (ADIPEX-P) 37.5 MG tablet Take 1 tablet (37.5 mg total) by mouth daily before breakfast.   [DISCONTINUED] predniSONE (DELTASONE) 10 MG tablet Take 6 on day 2  Take 5 on days 3 and  4 Take 4 on days 5 and 6 Take 3 on days 7 and 8 Take 2 on days 9 and 10 Take 1 on days 11 and 12   No facility-administered encounter medications on file as of 09/19/2020.    Patient Active Problem List   Diagnosis Date Noted   Hip pain, right 06/15/2020   GAD (generalized anxiety disorder) 11/20/2019   Class 2 obesity 09/09/2018   Stress at home 04/11/2017   Chronic insomnia 11/15/2015   Renal disorder     Past Medical History:  Diagnosis Date   Renal disorder    kidney stones    Relevant past medical, surgical, family and social history reviewed and updated as indicated. Interim medical history since our last visit reviewed.  Review of Systems Per HPI unless specifically indicated above     Objective:    There were no vitals taken for this visit.  Wt Readings from Last 3 Encounters:  06/13/20 218 lb 3.2 oz (99 kg)  04/15/20 216 lb (98 kg)  02/27/20 220 lb (99.8 kg)    Physical Exam Nursing note reviewed.  Constitutional:      General: She is not in acute distress.    Appearance: Normal appearance. She is obese. She is not toxic-appearing.  Eyes:     General: No scleral icterus. Pulmonary:     Effort: Pulmonary effort is normal. No respiratory distress.  Skin:    Coloration: Skin is not  jaundiced or pale.     Findings: No erythema.  Neurological:     Mental Status: She is alert and oriented to person, place, and time.     Motor: No weakness.     Gait: Gait normal.  Psychiatric:        Mood and Affect: Mood normal.        Behavior: Behavior normal.        Thought Content: Thought content normal.        Judgment: Judgment normal.       Assessment & Plan:  1. BMI 35.0-35.9,adult Unfortunately, GLP-1 therapy is not affordable for patient.  She did not have good results with Phentermine.  We will try Contrave and follow up in 2 months to recheck labs and to check on weight loss.  - Naltrexone-buPROPion HCl ER 8-90 MG TB12; Take 1 tablet by mouth daily with  breakfast for 7 days. Start 1 tablet every morning for 7 days, then 1 tablet twice daily for 7 days, then 2 tablets every morning and one in the evening for 7 days and continue at that dose until follow up.  Dispense: 150 tablet; Refill: 0  2. Dyslipidemia  - Naltrexone-buPROPion HCl ER 8-90 MG TB12; Take 1 tablet by mouth daily with breakfast for 7 days. Start 1 tablet every morning for 7 days, then 1 tablet twice daily for 7 days, then 2 tablets every morning and one in the evening for 7 days and continue at that dose until follow up.  Dispense: 150 tablet; Refill: 0    Follow up plan: Return in about 2 months (around 11/19/2020) for weight loss f/u.   Due to the catastrophic nature of the COVID-19 pandemic, this video visit was completed soley via audio and visual contact via [facility] due to the restrictions of the COVID-19 pandemic.  All issues as above were discussed and addressed. Physical exam was done as above through visual confirmation on [facility]. If it was felt that the patient should be evaluated in the office, they were directed there. The patient verbally consented to this visit. Location of the patient: work Location of the provider: work Those involved with this call:  Provider: Noemi Chapel, DNP, FNP-C CMA: Annabelle Harman, [place] Desk/Registration: Vevelyn Pat  Time spent on call:  14 minutes with patient face to face via video conference. More than 50% of this time was spent in counseling and coordination of care. 20 minutes total spent in review of patient's record and preparation of their chart. I verified patient identity using two factors (patient name and date of birth). Patient consents verbally to being seen via telemedicine visit today.

## 2020-09-19 ENCOUNTER — Telehealth (INDEPENDENT_AMBULATORY_CARE_PROVIDER_SITE_OTHER): Payer: BC Managed Care – PPO | Admitting: Nurse Practitioner

## 2020-09-19 ENCOUNTER — Other Ambulatory Visit: Payer: Self-pay

## 2020-09-19 ENCOUNTER — Encounter: Payer: Self-pay | Admitting: Nurse Practitioner

## 2020-09-19 DIAGNOSIS — E785 Hyperlipidemia, unspecified: Secondary | ICD-10-CM

## 2020-09-19 DIAGNOSIS — Z6835 Body mass index (BMI) 35.0-35.9, adult: Secondary | ICD-10-CM

## 2020-09-19 MED ORDER — NALTREXONE-BUPROPION HCL ER 8-90 MG PO TB12: 1.0000 | ORAL_TABLET | Freq: Every day | ORAL | 0 refills | Status: AC

## 2020-10-03 ENCOUNTER — Telehealth: Payer: Self-pay | Admitting: *Deleted

## 2020-10-03 NOTE — Telephone Encounter (Signed)
Received request from pharmacy for PA on Contrave.  PA submitted.   Dx: E66.09- [id number].   Your information has been submitted to [place]. [employer] [place] will review the request and notify you of the determination decision directly, typically within 72 hours of receiving all information.  You will also receive your request decision electronically. To check for an update later, open this request again from your dashboard.  If [employer] has not responded within the specified timeframe or if you have any questions about your PA submission, contact [facility] [place] directly at [phone].

## 2020-10-04 ENCOUNTER — Telehealth: Payer: Self-pay

## 2020-10-04 NOTE — Telephone Encounter (Signed)
Pt came in today trying to get info about a med that was needing prior authorization from insurance company. Pt would like a call back with some info about approval of this medication. Please advise.  Cb#: [phone]

## 2020-10-04 NOTE — Telephone Encounter (Signed)
Received PA determination.   PA denied.   Appeal faxed.

## 2020-10-04 NOTE — Telephone Encounter (Signed)
Please see prior message.   

## 2020-10-17 NOTE — Telephone Encounter (Signed)
Contave does not have generic prescription.   Have you seen anything about the appeal?

## 2020-10-17 NOTE — Telephone Encounter (Signed)
I reviewed a paper yesterday that said the appeal was denied because the medication was approved by insurance.  I put it to be scanned yesterday.

## 2020-10-17 NOTE — Telephone Encounter (Signed)
Patient left a voicemail message to follow up on request for a generic brand. Stated in message medication costs $700. Patient stated she's having an issue with getting a new Rx. Please advise at [phone].

## 2020-10-22 ENCOUNTER — Encounter: Payer: Self-pay | Admitting: Neurology

## 2020-10-22 ENCOUNTER — Institutional Professional Consult (permissible substitution): Payer: BC Managed Care – PPO | Admitting: Neurology

## 2020-10-30 ENCOUNTER — Other Ambulatory Visit: Payer: Self-pay | Admitting: *Deleted

## 2020-10-30 MED ORDER — NORETHINDRONE ACET-ETHINYL EST 1-20 MG-MCG PO TABS: 1.0000 | ORAL_TABLET | Freq: Every day | ORAL | 6 refills | Status: DC

## 2020-11-04 ENCOUNTER — Other Ambulatory Visit: Payer: Self-pay | Admitting: Family Medicine

## 2020-11-04 DIAGNOSIS — F411 Generalized anxiety disorder: Secondary | ICD-10-CM

## 2020-11-05 NOTE — Telephone Encounter (Signed)
Last ov 09/19/20 Last date filled 09/05/20

## 2020-11-05 NOTE — Telephone Encounter (Signed)
PDMP reviewed.  Patient had last refill 7/21. Will require office visit prior to future refills.

## 2020-11-26 DIAGNOSIS — F5101 Primary insomnia: Secondary | ICD-10-CM | POA: Diagnosis not present

## 2020-11-26 DIAGNOSIS — M25551 Pain in right hip: Secondary | ICD-10-CM | POA: Diagnosis not present

## 2020-11-26 DIAGNOSIS — F411 Generalized anxiety disorder: Secondary | ICD-10-CM | POA: Diagnosis not present

## 2020-11-26 DIAGNOSIS — Z23 Encounter for immunization: Secondary | ICD-10-CM | POA: Diagnosis not present

## 2020-12-11 DIAGNOSIS — M25511 Pain in right shoulder: Secondary | ICD-10-CM | POA: Diagnosis not present

## 2020-12-11 DIAGNOSIS — M25522 Pain in left elbow: Secondary | ICD-10-CM | POA: Diagnosis not present

## 2020-12-11 DIAGNOSIS — R5382 Chronic fatigue, unspecified: Secondary | ICD-10-CM | POA: Diagnosis not present

## 2020-12-11 DIAGNOSIS — M25521 Pain in right elbow: Secondary | ICD-10-CM | POA: Diagnosis not present

## 2020-12-11 DIAGNOSIS — M25512 Pain in left shoulder: Secondary | ICD-10-CM | POA: Diagnosis not present

## 2020-12-11 DIAGNOSIS — M79641 Pain in right hand: Secondary | ICD-10-CM | POA: Diagnosis not present

## 2020-12-11 DIAGNOSIS — M79642 Pain in left hand: Secondary | ICD-10-CM | POA: Diagnosis not present

## 2020-12-12 ENCOUNTER — Other Ambulatory Visit: Payer: Self-pay | Admitting: Nurse Practitioner

## 2020-12-12 DIAGNOSIS — F5104 Psychophysiologic insomnia: Secondary | ICD-10-CM

## 2020-12-24 DIAGNOSIS — M25512 Pain in left shoulder: Secondary | ICD-10-CM | POA: Diagnosis not present

## 2020-12-24 DIAGNOSIS — M25522 Pain in left elbow: Secondary | ICD-10-CM | POA: Diagnosis not present

## 2020-12-24 DIAGNOSIS — M0579 Rheumatoid arthritis with rheumatoid factor of multiple sites without organ or systems involvement: Secondary | ICD-10-CM | POA: Diagnosis not present

## 2020-12-24 DIAGNOSIS — M25511 Pain in right shoulder: Secondary | ICD-10-CM | POA: Diagnosis not present

## 2021-01-02 DIAGNOSIS — E669 Obesity, unspecified: Secondary | ICD-10-CM | POA: Diagnosis not present

## 2021-01-02 DIAGNOSIS — R030 Elevated blood-pressure reading, without diagnosis of hypertension: Secondary | ICD-10-CM | POA: Diagnosis not present

## 2021-01-29 DIAGNOSIS — M0579 Rheumatoid arthritis with rheumatoid factor of multiple sites without organ or systems involvement: Secondary | ICD-10-CM | POA: Diagnosis not present

## 2021-01-29 DIAGNOSIS — Z79899 Other long term (current) drug therapy: Secondary | ICD-10-CM | POA: Diagnosis not present

## 2021-02-12 DIAGNOSIS — E669 Obesity, unspecified: Secondary | ICD-10-CM | POA: Diagnosis not present

## 2021-02-12 DIAGNOSIS — Z79899 Other long term (current) drug therapy: Secondary | ICD-10-CM | POA: Diagnosis not present

## 2021-04-01 ENCOUNTER — Other Ambulatory Visit: Payer: Self-pay | Admitting: Nurse Practitioner

## 2021-04-01 ENCOUNTER — Other Ambulatory Visit: Payer: Self-pay

## 2021-06-05 DIAGNOSIS — Z0000 Encounter for general adult medical examination without abnormal findings: Secondary | ICD-10-CM | POA: Diagnosis not present

## 2021-06-05 DIAGNOSIS — F410 Panic disorder [episodic paroxysmal anxiety] without agoraphobia: Secondary | ICD-10-CM | POA: Diagnosis not present

## 2021-06-05 DIAGNOSIS — F5101 Primary insomnia: Secondary | ICD-10-CM | POA: Diagnosis not present

## 2021-06-05 DIAGNOSIS — F411 Generalized anxiety disorder: Secondary | ICD-10-CM | POA: Diagnosis not present

## 2021-06-06 ENCOUNTER — Other Ambulatory Visit: Payer: Self-pay

## 2021-06-06 ENCOUNTER — Other Ambulatory Visit: Payer: Self-pay | Admitting: Nurse Practitioner

## 2021-06-06 DIAGNOSIS — F5104 Psychophysiologic insomnia: Secondary | ICD-10-CM

## 2021-06-11 DIAGNOSIS — Z79899 Other long term (current) drug therapy: Secondary | ICD-10-CM | POA: Diagnosis not present

## 2021-06-11 DIAGNOSIS — M0579 Rheumatoid arthritis with rheumatoid factor of multiple sites without organ or systems involvement: Secondary | ICD-10-CM | POA: Diagnosis not present

## 2021-06-18 DIAGNOSIS — F5101 Primary insomnia: Secondary | ICD-10-CM | POA: Diagnosis not present

## 2021-06-18 DIAGNOSIS — Z0000 Encounter for general adult medical examination without abnormal findings: Secondary | ICD-10-CM | POA: Diagnosis not present

## 2021-06-30 DIAGNOSIS — I10 Essential (primary) hypertension: Secondary | ICD-10-CM | POA: Diagnosis not present

## 2021-06-30 DIAGNOSIS — Z79899 Other long term (current) drug therapy: Secondary | ICD-10-CM | POA: Diagnosis not present

## 2021-06-30 DIAGNOSIS — F411 Generalized anxiety disorder: Secondary | ICD-10-CM | POA: Diagnosis not present

## 2021-09-08 ENCOUNTER — Other Ambulatory Visit: Payer: Self-pay | Admitting: Nurse Practitioner

## 2021-09-08 DIAGNOSIS — F5104 Psychophysiologic insomnia: Secondary | ICD-10-CM

## 2021-09-25 DIAGNOSIS — M0579 Rheumatoid arthritis with rheumatoid factor of multiple sites without organ or systems involvement: Secondary | ICD-10-CM | POA: Diagnosis not present

## 2021-09-25 DIAGNOSIS — Z79899 Other long term (current) drug therapy: Secondary | ICD-10-CM | POA: Diagnosis not present

## 2021-09-25 DIAGNOSIS — R251 Tremor, unspecified: Secondary | ICD-10-CM | POA: Diagnosis not present

## 2021-10-06 ENCOUNTER — Other Ambulatory Visit: Payer: Self-pay | Admitting: Family Medicine

## 2021-10-22 DIAGNOSIS — B029 Zoster without complications: Secondary | ICD-10-CM | POA: Diagnosis not present

## 2021-11-06 ENCOUNTER — Other Ambulatory Visit (HOSPITAL_BASED_OUTPATIENT_CLINIC_OR_DEPARTMENT_OTHER): Payer: Self-pay | Admitting: Family Medicine

## 2021-11-06 DIAGNOSIS — Z1231 Encounter for screening mammogram for malignant neoplasm of breast: Secondary | ICD-10-CM

## 2021-11-07 ENCOUNTER — Encounter (HOSPITAL_BASED_OUTPATIENT_CLINIC_OR_DEPARTMENT_OTHER): Payer: Self-pay | Admitting: Radiology

## 2021-11-07 ENCOUNTER — Ambulatory Visit (HOSPITAL_BASED_OUTPATIENT_CLINIC_OR_DEPARTMENT_OTHER)
Admission: RE | Admit: 2021-11-07 | Discharge: 2021-11-07 | Disposition: A | Discharge status: Home / Self Care | Payer: BC Managed Care – PPO | Source: Ambulatory Visit | Attending: Family Medicine | Admitting: Family Medicine

## 2021-11-07 DIAGNOSIS — Z1231 Encounter for screening mammogram for malignant neoplasm of breast: Secondary | ICD-10-CM | POA: Insufficient documentation

## 2022-01-17 ENCOUNTER — Other Ambulatory Visit: Payer: Self-pay | Admitting: Nurse Practitioner

## 2022-01-17 DIAGNOSIS — F5104 Psychophysiologic insomnia: Secondary | ICD-10-CM

## 2022-01-19 NOTE — Telephone Encounter (Signed)
Unable to refill per protocol, Rx expired. Medication was discontinued 06/06/21. Dose change. Will refuse.  Requested Prescriptions  Pending Prescriptions Disp Refills   traZODone (DESYREL) 150 MG tablet [Pharmacy Med Name: TRAZODONE 150 MG TABLET] 90 tablet 1    Sig: TAKE 1 TABLET BY MOUTH EVERY DAY AT BEDTIME AS NEEDED FOR SLEEP     Psychiatry: Antidepressants - Serotonin Modulator Failed - 01/17/2022  3:13 PM      Failed - Valid encounter within last 6 months    Recent Outpatient Visits           1 year ago BMI 35.0-35.9,adult   [place] Eulogio Bear, NP   1 year ago BMI 35.0-35.9,adult   [place] Eulogio Bear, NP   1 year ago Chronic insomnia   [place], [place] Nunnery, MD   1 year ago Class 2 obesity   [place], [place] Nunnery, MD   2 years ago Routine general medical examination at a health care facility   [place], [place] Nunnery, MD

## 2022-01-21 DIAGNOSIS — F411 Generalized anxiety disorder: Secondary | ICD-10-CM | POA: Diagnosis not present

## 2022-01-21 DIAGNOSIS — I10 Essential (primary) hypertension: Secondary | ICD-10-CM | POA: Diagnosis not present

## 2022-01-21 DIAGNOSIS — F3341 Major depressive disorder, recurrent, in partial remission: Secondary | ICD-10-CM | POA: Diagnosis not present

## 2022-01-21 DIAGNOSIS — F5101 Primary insomnia: Secondary | ICD-10-CM | POA: Diagnosis not present

## 2022-01-21 DIAGNOSIS — Z129 Encounter for screening for malignant neoplasm, site unspecified: Secondary | ICD-10-CM | POA: Diagnosis not present

## 2022-01-21 DIAGNOSIS — Z23 Encounter for immunization: Secondary | ICD-10-CM | POA: Diagnosis not present

## 2022-01-23 DIAGNOSIS — M0579 Rheumatoid arthritis with rheumatoid factor of multiple sites without organ or systems involvement: Secondary | ICD-10-CM | POA: Diagnosis not present

## 2022-01-23 DIAGNOSIS — R251 Tremor, unspecified: Secondary | ICD-10-CM | POA: Diagnosis not present

## 2022-01-23 DIAGNOSIS — Z79899 Other long term (current) drug therapy: Secondary | ICD-10-CM | POA: Diagnosis not present

## 2022-04-06 DIAGNOSIS — F5101 Primary insomnia: Secondary | ICD-10-CM | POA: Diagnosis not present

## 2022-04-06 DIAGNOSIS — I10 Essential (primary) hypertension: Secondary | ICD-10-CM | POA: Diagnosis not present

## 2022-04-06 DIAGNOSIS — B028 Zoster with other complications: Secondary | ICD-10-CM | POA: Diagnosis not present

## 2022-04-06 DIAGNOSIS — F411 Generalized anxiety disorder: Secondary | ICD-10-CM | POA: Diagnosis not present

## 2022-04-28 DIAGNOSIS — I10 Essential (primary) hypertension: Secondary | ICD-10-CM | POA: Diagnosis not present

## 2022-04-28 DIAGNOSIS — J0140 Acute pansinusitis, unspecified: Secondary | ICD-10-CM | POA: Diagnosis not present

## 2022-04-28 DIAGNOSIS — Z6838 Body mass index (BMI) 38.0-38.9, adult: Secondary | ICD-10-CM | POA: Diagnosis not present

## 2022-06-15 IMAGING — MG DIGITAL SCREENING BILAT W/ TOMO W/ CAD
7 series · 8 of 19 positions shown · non-contrast
Comparison: Previous exam(s).

CLINICAL DATA: Screening.

EXAM:
DIGITAL SCREENING BILATERAL MAMMOGRAM WITH TOMO AND CAD

[L MLO synth-2D]
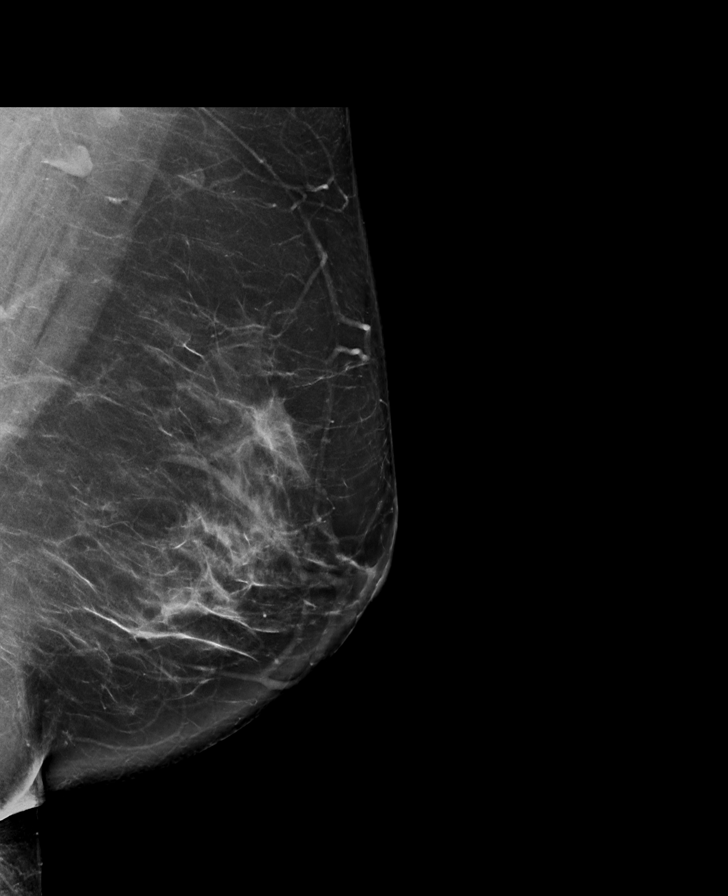

[R MLO synth-2D]
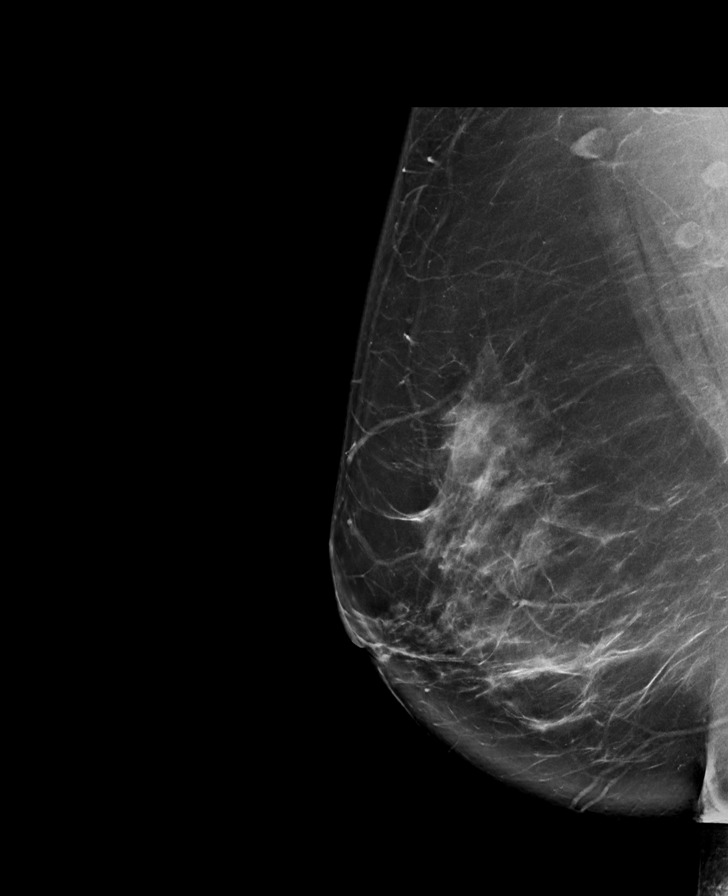

[R CC synth-2D]
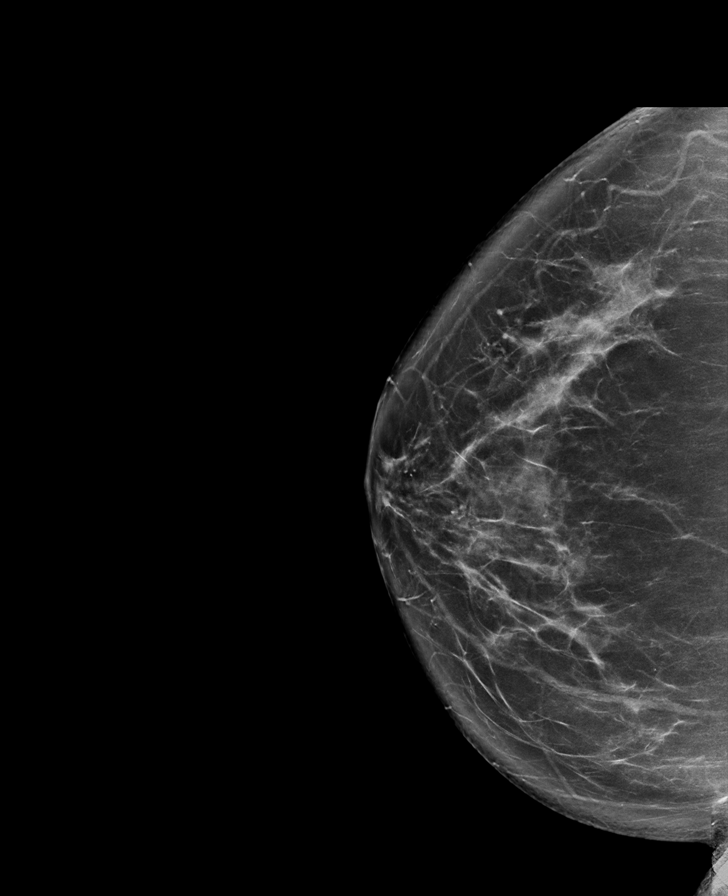

[L CC synth-2D]
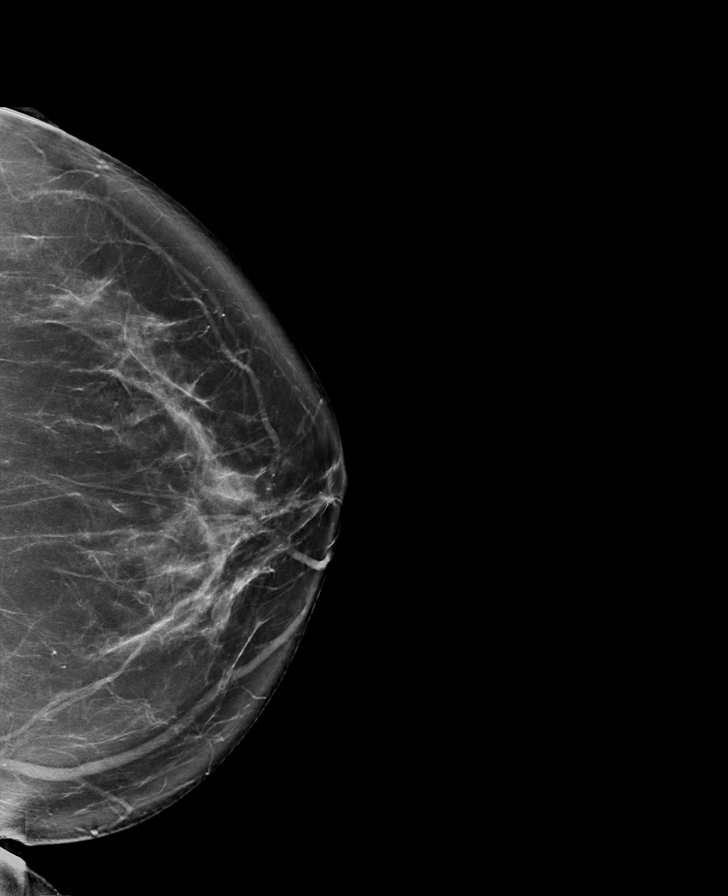

[L MLO tomo · 2 of 96 frames shown]
[frame 31/96]
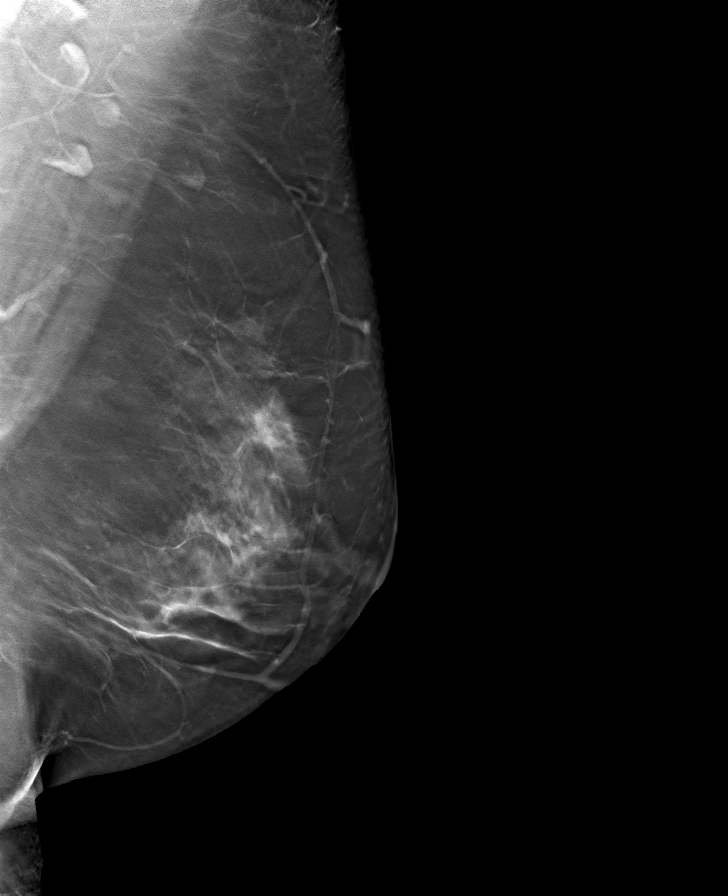
[frame 49/96]
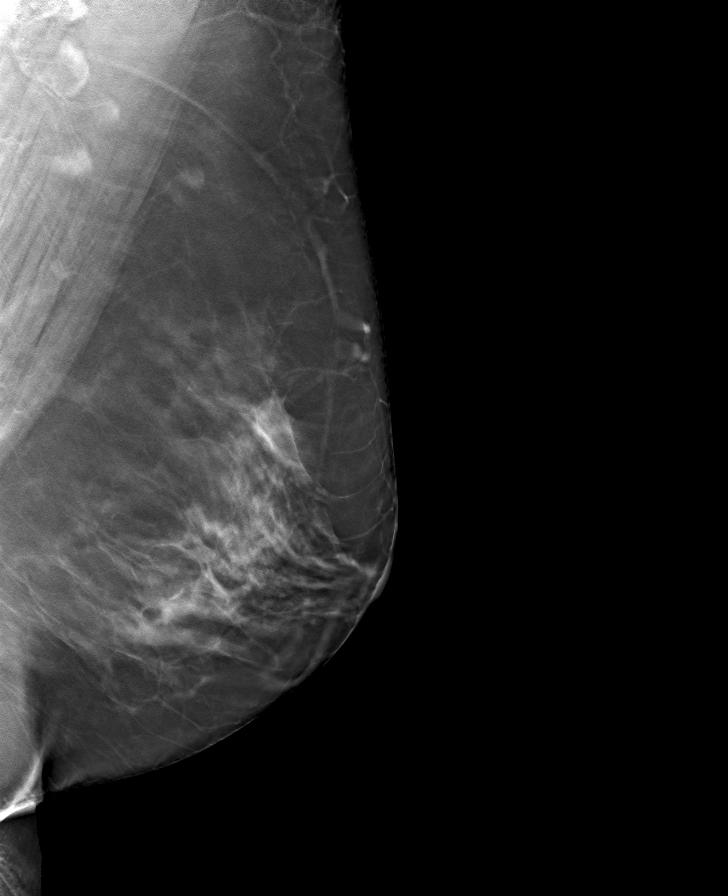

[L CC tomo · tomo slice 45/90.0]
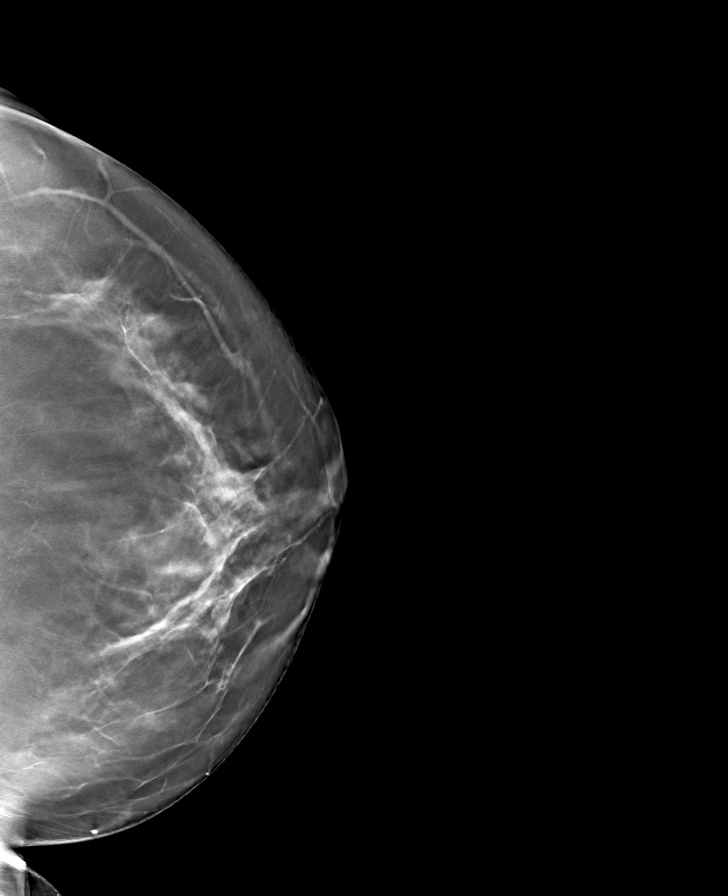

[R MLO tomo · tomo slice 51/101.0]
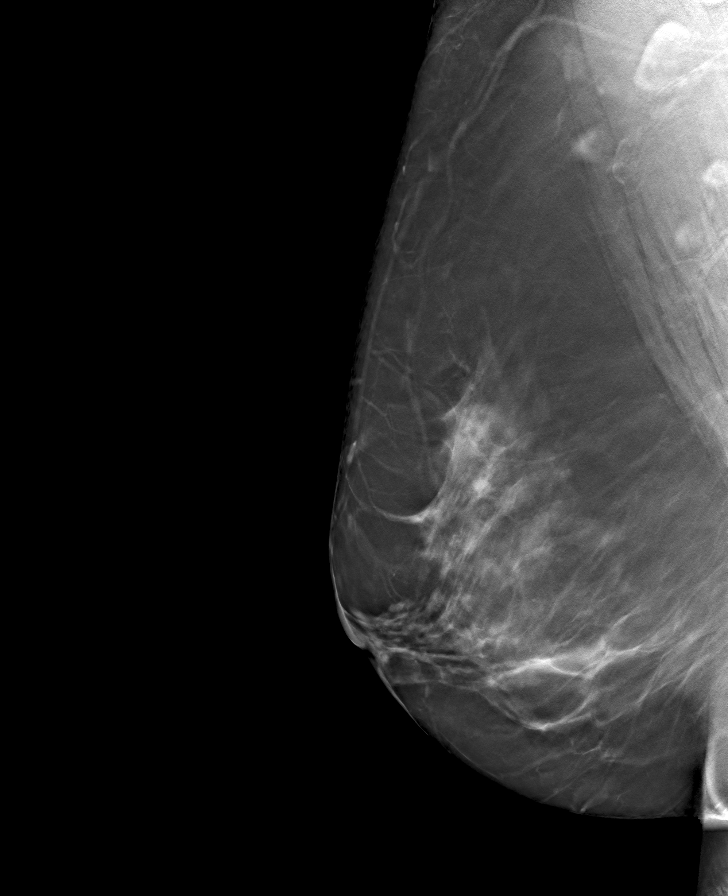

[8 of 19 positions shown; findings below may reference images not displayed]

ACR Breast Density Category c: The breast tissue is heterogeneously
dense, which may obscure small masses.
FINDINGS: There are no findings suspicious for malignancy. Images were
processed with CAD.
IMPRESSION: No mammographic evidence of malignancy. A result letter of this
screening mammogram will be mailed directly to the patient.

RECOMMENDATION:
Screening mammogram in one year. (Code:[id number])

BI-RADS CATEGORY  1: Negative.

## 2022-08-25 DIAGNOSIS — F5101 Primary insomnia: Secondary | ICD-10-CM | POA: Diagnosis not present

## 2022-08-25 DIAGNOSIS — F411 Generalized anxiety disorder: Secondary | ICD-10-CM | POA: Diagnosis not present

## 2022-08-25 DIAGNOSIS — Z6836 Body mass index (BMI) 36.0-36.9, adult: Secondary | ICD-10-CM | POA: Diagnosis not present

## 2022-08-25 DIAGNOSIS — E559 Vitamin D deficiency, unspecified: Secondary | ICD-10-CM | POA: Diagnosis not present

## 2022-08-25 DIAGNOSIS — I10 Essential (primary) hypertension: Secondary | ICD-10-CM | POA: Diagnosis not present

## 2022-08-25 DIAGNOSIS — M059 Rheumatoid arthritis with rheumatoid factor, unspecified: Secondary | ICD-10-CM | POA: Diagnosis not present

## 2022-08-25 DIAGNOSIS — Z131 Encounter for screening for diabetes mellitus: Secondary | ICD-10-CM | POA: Diagnosis not present

## 2022-08-25 DIAGNOSIS — F3341 Major depressive disorder, recurrent, in partial remission: Secondary | ICD-10-CM | POA: Diagnosis not present

## 2022-12-07 DIAGNOSIS — I10 Essential (primary) hypertension: Secondary | ICD-10-CM | POA: Diagnosis not present

## 2022-12-07 DIAGNOSIS — E7800 Pure hypercholesterolemia, unspecified: Secondary | ICD-10-CM | POA: Diagnosis not present

## 2022-12-07 DIAGNOSIS — M059 Rheumatoid arthritis with rheumatoid factor, unspecified: Secondary | ICD-10-CM | POA: Diagnosis not present

## 2022-12-07 DIAGNOSIS — F5101 Primary insomnia: Secondary | ICD-10-CM | POA: Diagnosis not present

## 2022-12-07 DIAGNOSIS — F411 Generalized anxiety disorder: Secondary | ICD-10-CM | POA: Diagnosis not present

## 2022-12-16 DIAGNOSIS — G4719 Other hypersomnia: Secondary | ICD-10-CM | POA: Diagnosis not present

## 2023-03-12 DIAGNOSIS — I10 Essential (primary) hypertension: Secondary | ICD-10-CM | POA: Diagnosis not present

## 2023-03-12 DIAGNOSIS — E7800 Pure hypercholesterolemia, unspecified: Secondary | ICD-10-CM | POA: Diagnosis not present

## 2023-03-16 DIAGNOSIS — F411 Generalized anxiety disorder: Secondary | ICD-10-CM | POA: Diagnosis not present

## 2023-03-16 DIAGNOSIS — F3341 Major depressive disorder, recurrent, in partial remission: Secondary | ICD-10-CM | POA: Diagnosis not present

## 2023-03-16 DIAGNOSIS — I10 Essential (primary) hypertension: Secondary | ICD-10-CM | POA: Diagnosis not present

## 2023-03-16 DIAGNOSIS — F5101 Primary insomnia: Secondary | ICD-10-CM | POA: Diagnosis not present

## 2023-03-16 DIAGNOSIS — M059 Rheumatoid arthritis with rheumatoid factor, unspecified: Secondary | ICD-10-CM | POA: Diagnosis not present

## 2023-03-16 DIAGNOSIS — Z0000 Encounter for general adult medical examination without abnormal findings: Secondary | ICD-10-CM | POA: Diagnosis not present

## 2023-03-23 ENCOUNTER — Other Ambulatory Visit: Payer: Self-pay | Admitting: Family Medicine

## 2023-03-23 DIAGNOSIS — N644 Mastodynia: Secondary | ICD-10-CM

## 2023-04-01 ENCOUNTER — Ambulatory Visit
Admission: RE | Admit: 2023-04-01 | Discharge: 2023-04-01 | Disposition: A | Discharge status: Home / Self Care | Payer: BC Managed Care – PPO | Source: Ambulatory Visit | Attending: Family Medicine | Admitting: Family Medicine

## 2023-04-01 DIAGNOSIS — N644 Mastodynia: Secondary | ICD-10-CM | POA: Diagnosis not present

## 2023-04-15 DIAGNOSIS — Z713 Dietary counseling and surveillance: Secondary | ICD-10-CM | POA: Diagnosis not present

## 2023-04-15 DIAGNOSIS — Z124 Encounter for screening for malignant neoplasm of cervix: Secondary | ICD-10-CM | POA: Diagnosis not present

## 2023-04-15 DIAGNOSIS — Z6841 Body Mass Index (BMI) 40.0 and over, adult: Secondary | ICD-10-CM | POA: Diagnosis not present

## 2023-09-17 DIAGNOSIS — F410 Panic disorder [episodic paroxysmal anxiety] without agoraphobia: Secondary | ICD-10-CM | POA: Diagnosis not present

## 2023-09-17 DIAGNOSIS — I10 Essential (primary) hypertension: Secondary | ICD-10-CM | POA: Diagnosis not present

## 2023-09-17 DIAGNOSIS — M79605 Pain in left leg: Secondary | ICD-10-CM | POA: Diagnosis not present

## 2023-12-22 DIAGNOSIS — F3341 Major depressive disorder, recurrent, in partial remission: Secondary | ICD-10-CM | POA: Diagnosis not present

## 2023-12-22 DIAGNOSIS — I10 Essential (primary) hypertension: Secondary | ICD-10-CM | POA: Diagnosis not present

## 2023-12-22 DIAGNOSIS — F410 Panic disorder [episodic paroxysmal anxiety] without agoraphobia: Secondary | ICD-10-CM | POA: Diagnosis not present

## 2023-12-22 DIAGNOSIS — E66813 Obesity, class 3: Secondary | ICD-10-CM | POA: Diagnosis not present
